# Patient Record
Sex: Female | Born: 1935 | Race: White | Hispanic: No
Health system: Southern US, Community
[De-identification: ages and names within clinical notes are randomized; demographics above are authoritative.]

## PROBLEM LIST (undated history)

## (undated) DIAGNOSIS — M1990 Unspecified osteoarthritis, unspecified site: Secondary | ICD-10-CM

## (undated) DIAGNOSIS — F419 Anxiety disorder, unspecified: Secondary | ICD-10-CM

## (undated) DIAGNOSIS — I10 Essential (primary) hypertension: Secondary | ICD-10-CM

## (undated) DIAGNOSIS — F0390 Unspecified dementia without behavioral disturbance: Secondary | ICD-10-CM

## (undated) DIAGNOSIS — K219 Gastro-esophageal reflux disease without esophagitis: Secondary | ICD-10-CM

## (undated) DIAGNOSIS — D649 Anemia, unspecified: Secondary | ICD-10-CM

## (undated) DIAGNOSIS — M545 Low back pain, unspecified: Secondary | ICD-10-CM

---
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
## 1998-12-24 ENCOUNTER — Ambulatory Visit (HOSPITAL_COMMUNITY): Admission: RE | Admit: 1998-12-24 | Discharge: 1998-12-24 | Payer: Self-pay | Admitting: *Deleted

## 2000-10-20 ENCOUNTER — Encounter: Admission: RE | Admit: 2000-10-20 | Discharge: 2000-12-11 | Payer: Self-pay

## 2000-11-13 ENCOUNTER — Ambulatory Visit (HOSPITAL_COMMUNITY): Admission: RE | Admit: 2000-11-13 | Discharge: 2000-11-14 | Payer: Self-pay | Admitting: Ophthalmology

## 2000-11-13 ENCOUNTER — Encounter: Payer: Self-pay | Admitting: Ophthalmology

## 2001-01-23 ENCOUNTER — Other Ambulatory Visit: Admission: RE | Admit: 2001-01-23 | Discharge: 2001-01-23 | Payer: Self-pay | Admitting: *Deleted

## 2002-04-10 ENCOUNTER — Encounter (HOSPITAL_COMMUNITY): Payer: Self-pay | Admitting: Oncology

## 2002-04-10 ENCOUNTER — Ambulatory Visit (HOSPITAL_COMMUNITY): Admission: RE | Admit: 2002-04-10 | Discharge: 2002-04-10 | Payer: Self-pay | Admitting: Oncology

## 2002-08-01 ENCOUNTER — Encounter: Payer: Self-pay | Admitting: Surgery

## 2002-08-01 ENCOUNTER — Ambulatory Visit (HOSPITAL_COMMUNITY): Admission: RE | Admit: 2002-08-01 | Discharge: 2002-08-01 | Payer: Self-pay | Admitting: Surgery

## 2002-10-15 ENCOUNTER — Encounter: Payer: Self-pay | Admitting: Surgery

## 2002-10-17 ENCOUNTER — Ambulatory Visit (HOSPITAL_COMMUNITY): Admission: RE | Admit: 2002-10-17 | Discharge: 2002-10-18 | Payer: Self-pay | Admitting: Surgery

## 2002-10-17 ENCOUNTER — Encounter (INDEPENDENT_AMBULATORY_CARE_PROVIDER_SITE_OTHER): Payer: Self-pay | Admitting: *Deleted

## 2004-05-25 ENCOUNTER — Ambulatory Visit (HOSPITAL_COMMUNITY): Admission: RE | Admit: 2004-05-25 | Discharge: 2004-05-25 | Payer: Self-pay | Admitting: Oncology

## 2004-05-25 ENCOUNTER — Ambulatory Visit: Payer: Self-pay | Admitting: Oncology

## 2004-11-22 ENCOUNTER — Ambulatory Visit: Payer: Self-pay | Admitting: Oncology

## 2005-05-23 ENCOUNTER — Ambulatory Visit: Payer: Self-pay | Admitting: Oncology

## 2005-11-17 ENCOUNTER — Ambulatory Visit: Payer: Self-pay | Admitting: Oncology

## 2005-11-22 LAB — COMPREHENSIVE METABOLIC PANEL
Albumin: 4.3 g/dL (ref 3.5–5.2)
BUN: 26 mg/dL — ABNORMAL HIGH (ref 6–23)
CO2: 24 mEq/L (ref 19–32)
Calcium: 9.4 mg/dL (ref 8.4–10.5)
Chloride: 107 mEq/L (ref 96–112)
Creatinine, Ser: 1.03 mg/dL (ref 0.40–1.20)

## 2005-11-22 LAB — CBC WITH DIFFERENTIAL/PLATELET
Basophils Absolute: 0 10*3/uL (ref 0.0–0.1)
Eosinophils Absolute: 0.1 10*3/uL (ref 0.0–0.5)
HCT: 36.4 % (ref 34.8–46.6)
HGB: 12.5 g/dL (ref 11.6–15.9)
MONO#: 0.6 10*3/uL (ref 0.1–0.9)
NEUT#: 3.1 10*3/uL (ref 1.5–6.5)
NEUT%: 44.4 % (ref 39.6–76.8)
WBC: 6.9 10*3/uL (ref 3.9–10.0)
lymph#: 3.1 10*3/uL (ref 0.9–3.3)

## 2005-11-22 LAB — LACTATE DEHYDROGENASE: LDH: 163 U/L (ref 94–250)

## 2005-11-22 LAB — IGG, IGA, IGM
IgA: 92 mg/dL (ref 68–378)
IgM, Serum: 428 mg/dL — ABNORMAL HIGH (ref 60–263)

## 2006-05-18 ENCOUNTER — Ambulatory Visit: Payer: Self-pay | Admitting: Oncology

## 2007-04-04 ENCOUNTER — Ambulatory Visit: Payer: Self-pay | Admitting: Oncology

## 2007-04-10 LAB — CBC WITH DIFFERENTIAL/PLATELET
BASO%: 0.5 % (ref 0.0–2.0)
EOS%: 0.5 % (ref 0.0–7.0)
HCT: 39.9 % (ref 34.8–46.6)
MCH: 30.3 pg (ref 26.0–34.0)
MCHC: 34.3 g/dL (ref 32.0–36.0)
MCV: 88.6 fL (ref 81.0–101.0)
MONO%: 4.4 % (ref 0.0–13.0)
NEUT%: 60.3 % (ref 39.6–76.8)
RDW: 13.2 % (ref 11.3–14.5)
lymph#: 3.7 10*3/uL — ABNORMAL HIGH (ref 0.9–3.3)

## 2007-04-10 LAB — COMPREHENSIVE METABOLIC PANEL
ALT: 41 U/L — ABNORMAL HIGH (ref 0–35)
AST: 28 U/L (ref 0–37)
Alkaline Phosphatase: 102 U/L (ref 39–117)
BUN: 24 mg/dL — ABNORMAL HIGH (ref 6–23)
Calcium: 9.6 mg/dL (ref 8.4–10.5)
Chloride: 104 mEq/L (ref 96–112)
Creatinine, Ser: 0.79 mg/dL (ref 0.40–1.20)
Total Bilirubin: 0.5 mg/dL (ref 0.3–1.2)

## 2007-04-10 LAB — LACTATE DEHYDROGENASE: LDH: 186 U/L (ref 94–250)

## 2007-04-10 LAB — IGG, IGA, IGM: IgM, Serum: 358 mg/dL — ABNORMAL HIGH (ref 60–263)

## 2007-10-03 ENCOUNTER — Ambulatory Visit: Payer: Self-pay | Admitting: Oncology

## 2008-10-22 ENCOUNTER — Encounter: Admission: RE | Admit: 2008-10-22 | Discharge: 2008-10-22 | Payer: Self-pay | Admitting: Internal Medicine

## 2010-08-20 NOTE — Op Note (Signed)
NAME:  Kimberly Dominguez, Kimberly Dominguez               ACCOUNT NO.:  [id number]   MEDICAL RECORD NO.:  [id number]                   PATIENT TYPE:  OIB   LOCATION:  2550                                 FACILITY:  [facility]   PHYSICIAN:  [name], M.D.                DATE OF BIRTH:  1922-06-15   DATE OF PROCEDURE:  10/17/2002  DATE OF DISCHARGE:                                 OPERATIVE REPORT   PREOPERATIVE DIAGNOSIS:  Primary hyperparathyroidism.   POSTOPERATIVE DIAGNOSIS:  Primary hyperparathyroidism.   OPERATION PERFORMED:  Minimally invasive parathyroidectomy (left inferior  gland).   SURGEON:  [name], M.D.   ASSISTANT:  Abigail Miyamoto, M.D.   ANESTHESIA:  General.   ESTIMATED BLOOD LOSS:  Minimal.   PREPARATION:  Betadine.   COMPLICATIONS:  None.   INDICATIONS FOR PROCEDURE:  The patient is a 77 year old white female  referred by [name], M.D. with hypercalcemia.  The patient had  longstanding hypercalcemia with serum calcium levels ranging from 11.4 to  12.0.  Intact PTH level was elevated at 106.  Sestamibi scan was obtained  and showed an area of increased uptake in the left inferior position. The  patient was prepared and brought to the operating room at this time.   DESCRIPTION OF PROCEDURE:  The procedure was done in operating room # 16 at  the [facility]. [facility].  The patient was brought to the  operating room and placed in supine position on the operating room table.  Following administration of general anesthesia, the patient was positioned  and then prepped and draped in the usual strict aseptic fashion.  After  ascertaining that an adequate level of anesthesia had been obtained, the  Neoprobe was used to scan the neck.  An area of increased activity was noted  at the left inferior position.  Using a #10 blade, the incision was made in  the anterior neck just above head of the left clavicle.  Dissection was  carried down through  subcutaneous tissues.  The platysma was divided.  Hemostasis was obtained with the electrocautery.  Subplatysmal flaps were  elevated cephalad and caudad.  The strap muscles were identified and incised  in the midline.  Strap muscles were reflected laterally.  Inferior pole of  the left thyroid lobe was exposed.  Using the Neoprobe for guidance, an area  of increased activity was noted below the left thyroid lobe.  The  parathyroid gland was identified.  It was carefully dissected out.  Vascular  tributaries were divided between small and medium Ligaclips.  The gland was  dissected away from surrounding adipose tissue and excised completely.  It  was sectioned on the table and appeared to be parathyroid tissue.  It was  submitted to pathology for review.  Dr. Tammi Sou confirmed on frozen  section that parathyroid tissue was present consistent with parathyroid  adenoma.  The neck was again explored.  Good hemostasis was noted.  Recurrent laryngeal nerve was identified and preserved.  Small piece of  Surgicel was placed in the bed of the dissection.  Strap muscles were  reapproximated in the midline with interrupted 3-0 Vicryl sutures.  Platysma  was closed with interrupted 3-0 Vicryl sutures.  Skin edges were  anesthetized with local Marcaine anesthetic.  Skin edges were reapproximated  with interrupted 4-0 Vicryl subcuticular sutures.  Wound was washed and  dried and Steri-Strips are applied.  Sterile gauze dressings were applied.  The patient was awakened from anesthesia and brought to the recovery room in  stable condition.  The patient tolerated the procedure well.                                                [name], M.D.    TMG/MEDQ  D:  10/17/2002  T:  10/17/2002  Job:  [id number]   cc:   [name], M.D.  [address]  [place], [place] [address]  Fax: [phone]

## 2013-01-29 ENCOUNTER — Other Ambulatory Visit: Payer: Self-pay | Admitting: Nurse Practitioner

## 2013-01-29 ENCOUNTER — Ambulatory Visit
Admission: RE | Admit: 2013-01-29 | Discharge: 2013-01-29 | Disposition: A | Discharge status: Home / Self Care | Payer: Medicare Other | Source: Ambulatory Visit | Attending: Nurse Practitioner | Admitting: Nurse Practitioner

## 2013-01-29 DIAGNOSIS — M25551 Pain in right hip: Secondary | ICD-10-CM

## 2014-05-23 DIAGNOSIS — L989 Disorder of the skin and subcutaneous tissue, unspecified: Secondary | ICD-10-CM | POA: Diagnosis not present

## 2014-05-23 DIAGNOSIS — I129 Hypertensive chronic kidney disease with stage 1 through stage 4 chronic kidney disease, or unspecified chronic kidney disease: Secondary | ICD-10-CM | POA: Diagnosis not present

## 2014-05-23 DIAGNOSIS — N183 Chronic kidney disease, stage 3 (moderate): Secondary | ICD-10-CM | POA: Diagnosis not present

## 2014-05-26 ENCOUNTER — Other Ambulatory Visit: Payer: Self-pay | Admitting: Dermatology

## 2014-05-26 DIAGNOSIS — L918 Other hypertrophic disorders of the skin: Secondary | ICD-10-CM | POA: Diagnosis not present

## 2014-06-01 DIAGNOSIS — M19021 Primary osteoarthritis, right elbow: Secondary | ICD-10-CM | POA: Diagnosis not present

## 2014-07-14 DIAGNOSIS — H35033 Hypertensive retinopathy, bilateral: Secondary | ICD-10-CM | POA: Diagnosis not present

## 2014-07-14 DIAGNOSIS — H524 Presbyopia: Secondary | ICD-10-CM | POA: Diagnosis not present

## 2014-07-14 DIAGNOSIS — H40013 Open angle with borderline findings, low risk, bilateral: Secondary | ICD-10-CM | POA: Diagnosis not present

## 2014-07-14 DIAGNOSIS — Z961 Presence of intraocular lens: Secondary | ICD-10-CM | POA: Diagnosis not present

## 2014-09-03 ENCOUNTER — Emergency Department (HOSPITAL_COMMUNITY)
Admission: EM | Admit: 2014-09-03 | Discharge: 2014-09-03 | Disposition: A | Discharge status: Home / Self Care | Payer: Medicare Other | Attending: Emergency Medicine | Admitting: Emergency Medicine

## 2014-09-03 ENCOUNTER — Encounter (HOSPITAL_COMMUNITY): Payer: Self-pay | Admitting: Emergency Medicine

## 2014-09-03 ENCOUNTER — Emergency Department (HOSPITAL_COMMUNITY): Payer: Medicare Other

## 2014-09-03 DIAGNOSIS — Y9289 Other specified places as the place of occurrence of the external cause: Secondary | ICD-10-CM | POA: Insufficient documentation

## 2014-09-03 DIAGNOSIS — S065XAA Traumatic subdural hemorrhage with loss of consciousness status unknown, initial encounter: Secondary | ICD-10-CM

## 2014-09-03 DIAGNOSIS — Y9301 Activity, walking, marching and hiking: Secondary | ICD-10-CM | POA: Diagnosis not present

## 2014-09-03 DIAGNOSIS — R51 Headache: Secondary | ICD-10-CM | POA: Diagnosis not present

## 2014-09-03 DIAGNOSIS — S0990XA Unspecified injury of head, initial encounter: Secondary | ICD-10-CM | POA: Diagnosis not present

## 2014-09-03 DIAGNOSIS — K219 Gastro-esophageal reflux disease without esophagitis: Secondary | ICD-10-CM | POA: Diagnosis not present

## 2014-09-03 DIAGNOSIS — S0083XA Contusion of other part of head, initial encounter: Secondary | ICD-10-CM | POA: Diagnosis not present

## 2014-09-03 DIAGNOSIS — S299XXA Unspecified injury of thorax, initial encounter: Secondary | ICD-10-CM | POA: Diagnosis not present

## 2014-09-03 DIAGNOSIS — Y998 Other external cause status: Secondary | ICD-10-CM | POA: Insufficient documentation

## 2014-09-03 DIAGNOSIS — F0390 Unspecified dementia without behavioral disturbance: Secondary | ICD-10-CM | POA: Insufficient documentation

## 2014-09-03 DIAGNOSIS — W108XXA Fall (on) (from) other stairs and steps, initial encounter: Secondary | ICD-10-CM | POA: Insufficient documentation

## 2014-09-03 DIAGNOSIS — W19XXXA Unspecified fall, initial encounter: Secondary | ICD-10-CM

## 2014-09-03 DIAGNOSIS — S065X4A Traumatic subdural hemorrhage with loss of consciousness of 6 hours to 24 hours, initial encounter: Secondary | ICD-10-CM | POA: Insufficient documentation

## 2014-09-03 DIAGNOSIS — S3690XA Unspecified injury of unspecified intra-abdominal organ, initial encounter: Secondary | ICD-10-CM | POA: Diagnosis not present

## 2014-09-03 DIAGNOSIS — S065X9A Traumatic subdural hemorrhage with loss of consciousness of unspecified duration, initial encounter: Secondary | ICD-10-CM

## 2014-09-03 DIAGNOSIS — S065X0A Traumatic subdural hemorrhage without loss of consciousness, initial encounter: Secondary | ICD-10-CM | POA: Diagnosis not present

## 2014-09-03 HISTORY — DX: Unspecified dementia, unspecified severity, without behavioral disturbance, psychotic disturbance, mood disturbance, and anxiety: F03.90

## 2014-09-03 HISTORY — DX: Gastro-esophageal reflux disease without esophagitis: K21.9

## 2014-09-03 NOTE — ED Notes (Addendum)
Witnessed fall: Pt missed a step while walking down stairs and fell to floor, struck head on stair. Pt has baseline short-term memory loss. Large hematoma on left forehead. No LOC, no vomiting. 128/72, hr 84, rr16, cbg145. Alert. Here from "[employer]

## 2014-09-03 NOTE — ED Provider Notes (Signed)
CSN: [id number]     Arrival date & time 09/03/14  1853 History   First MD Initiated Contact with Patient 09/03/14 2003     Chief Complaint  Patient presents with  . Fall     (Consider location/radiation/quality/duration/timing/severity/associated sxs/prior Treatment) HPI The patient misstepped and fell down 2 stairs today prior to arrival. She struck her forehead on a carpeted floor and developed a large hematoma. There is no reported loss of consciousness. No vomiting. The patient is alert but has a baseline dementia with significant short-term memory loss. The patient does endorse pain in the area of her swelling. She is also indicated to her daughter some pain on the left side of her chest. She has not been observed to have any difficulty breathing. Past Medical History  Diagnosis Date  . Dementia   . GERD (gastroesophageal reflux disease)    History reviewed. No pertinent past surgical history. History reviewed. No pertinent family history. History  Substance Use Topics  . Smoking status: Unknown If Ever Smoked  . Smokeless tobacco: Not on file  . Alcohol Use: No   OB History    No data available     Review of Systems  Review systems Limited by dementia. No report of recent illness or symptoms.  Allergies  Review of patient's allergies indicates no known allergies.  Home Medications   Prior to Admission medications   Medication Sig Start Date End Date Taking? Authorizing Provider  acetaminophen (TYLENOL) 500 MG tablet Take 500 mg by mouth every 6 (six) hours as needed for mild pain or moderate pain.   Yes Historical Provider, MD  alum & mag hydroxide-simeth (MAALOX PLUS) 400-400-40 MG/5ML suspension Take 30 mLs by mouth 2 (two) times daily as needed for indigestion.   Yes Historical Provider, MD  donepezil (ARICEPT) 10 MG tablet Take 10 mg by mouth at bedtime.   Yes Historical Provider, MD  ferrous sulfate 325 (65 FE) MG tablet Take 325 mg by mouth daily with breakfast.    Yes Historical Provider, MD  memantine (NAMENDA XR) 28 MG CP24 24 hr capsule Take 28 mg by mouth daily.   Yes Historical Provider, MD  pantoprazole (PROTONIX) 40 MG tablet Take 40 mg by mouth daily.   Yes Historical Provider, MD  polyethylene glycol (MIRALAX / GLYCOLAX) packet Take 17 g by mouth daily.   Yes Historical Provider, MD  ramipril (ALTACE) 2.5 MG capsule Take 2.5 mg by mouth daily.   Yes Historical Provider, MD  sertraline (ZOLOFT) 50 MG tablet Take 50 mg by mouth daily.   Yes Historical Provider, MD   BP 145/74 mmHg  Pulse 63  Temp(Src) 97.7 F (36.5 C) (Oral)  Resp 16  SpO2 96% Physical Exam  Constitutional: She appears well-developed and well-nourished. No distress.  HENT:  5 cm hematoma to the left forehead. No laceration.  Eyes: EOM are normal. Pupils are equal, round, and reactive to light.  Neck: Neck supple.  No C-spine tenderness.  Cardiovascular: Normal rate, regular rhythm, normal heart sounds and intact distal pulses.   Pulmonary/Chest: Effort normal and breath sounds normal. She exhibits no tenderness.  Abdominal: Soft. Bowel sounds are normal. She exhibits no distension. There is no tenderness.  Musculoskeletal: Normal range of motion. She exhibits no edema or tenderness.  Patient demonstrates all range of motion of both upper extremities by waving them back and forth and also both lower extremities by a bicycling motion.  Neurological: She is alert. She has normal strength. Coordination normal. GCS  eye subscore is 4. GCS verbal subscore is 5. GCS motor subscore is 6.  Patient has evident short-term memory loss and dementia. She is alert and cooperative.  Skin: Skin is warm, dry and intact.  Psychiatric: She has a normal mood and affect.    ED Course  Procedures (including critical care time) Labs Review Labs Reviewed - No data to display  Imaging Review Dg Chest 2 View  09/03/2014   CLINICAL DATA:  Fall while walking down stairs.  Initial encounter.   EXAM: CHEST - 2 VIEW  COMPARISON:  None  FINDINGS: The heart size and mediastinal contours are within normal limits. Bibasilar scarring and atelectasis present. There is no evidence of pulmonary edema, consolidation, pneumothorax, nodule or pleural fluid. The visualized skeletal structures are unremarkable. There is likely a hiatal hernia.  IMPRESSION: No active disease.   Electronically Signed   By: Aletta Edouard M.D.   On: 09/03/2014 22:00   Dg Ribs Unilateral Left  09/03/2014   CLINICAL DATA:  Fall with left rib pain. Missed a step while walking downstairs, now with left anterior rib pain.  EXAM: LEFT RIBS - 2 VIEW  COMPARISON:  None.  FINDINGS: The cortical margins of the left ribs are intact. No fracture or destructive rib lesion. There is minimal linear atelectasis at the left lung base, no pneumothorax, pleural effusion or consolidation.  IMPRESSION: Intact left ribs without fracture.   Electronically Signed   By: Jeb Levering M.D.   On: 09/03/2014 22:00   Ct Head Wo Contrast  09/04/2014   CLINICAL DATA:  81 year old female with history of subdural hematoma and scalp hematoma in the left frontal region after a fall on 09/03/2014. Followup examination.  EXAM: CT HEAD WITHOUT CONTRAST  TECHNIQUE: Contiguous axial images were obtained from the base of the skull through the vertex without intravenous contrast.  COMPARISON:  Head CT 09/03/2014.  FINDINGS: Again noted is a small left frontal scalp hematoma which appears slightly decreased in size compared to the prior examination. Underlying this in the left frontal region there is no residual high attenuation material to suggest residual subdural hematoma. The previously noted subdural hematoma has presumably shifted or resolved. No intraparenchymal hemorrhage or intraventricular hemorrhage. No hydrocephalus. Patchy areas of mild decreased attenuation throughout the deep and periventricular white matter of the cerebral hemispheres is compatible with very  mild chronic microvascular ischemic disease, similar to the prior examination. Mild cerebral atrophy. No intra-axial mass. No acute displaced skull fractures. Visualized paranasal sinuses and mastoids are well pneumatized.  IMPRESSION: 1. Resolution of previously suspected left frontal subdural hematoma. Slight decreased size of left frontal scalp hematoma. 2. Mild cerebral atrophy and mild chronic microvascular ischemic changes in the cerebral white matter.   Electronically Signed   By: Vinnie Langton M.D.   On: 09/04/2014 18:22   Ct Head Wo Contrast  09/03/2014   CLINICAL DATA:  81 year old female post witnessed fall. Missed a step wall walking downstairs, fall to floor striking head. Hematoma to left forehead. No loss of consciousness.  EXAM: CT HEAD WITHOUT CONTRAST  TECHNIQUE: Contiguous axial images were obtained from the base of the skull through the vertex without intravenous contrast.  COMPARISON:  None.  FINDINGS: Suspect small left frontal subdural hematoma measuring 4 mm in depth. This is immediately subjacent to a left frontal scalp hematoma. No calvarial fracture. There is no associated mass effect or midline shift. Age related volume loss and chronic small vessel ischemic change. No territorial infarct. No hydrocephalus,  basilar cisterns are patent. Paranasal sinuses and mastoid air cells are well aerated.  IMPRESSION: Suspect thin left frontal subdural hematoma measuring 4 mm. This is immediately subjacent to a left frontal scalp hematoma.  Critical Value/emergent results were called by telephone at the time of interpretation on 09/03/2014 at 9:50 pm to Dr. Charlesetta Shanks , who verbally acknowledged these results.   Electronically Signed   By: Jeb Levering M.D.   On: 09/03/2014 21:51     EKG Interpretation None     Consult: Patient's case was reviewed with Dr. Sherwood Gambler of neurosurgery. He advises that this is a very small or potentially artifact finding. This would not need neurosurgical  intervention. Options for management are home observation with return if changing symptoms or 24-hour observation in the hospital. MDM   Final diagnoses:  Subdural hematoma  Traumatic hematoma of forehead, initial encounter   The patient daughter feels comfortable taking her home and observing her for any signs of change. Instructions are provided. The patient is alert and in no distress. She has significant dementia but is neurologically intact for following commands and motor function.    Charlesetta Shanks, MD 09/05/14 [phone]

## 2014-09-03 NOTE — Discharge Instructions (Signed)
Subdural Hematoma  A subdural hematoma is a collection of blood between the brain and its tough outermost membrane covering (the dura).  Blood clots that form in this area push down on the brain and cause irritation. A subdural hematoma may cause parts of the brain to stop working and eventually cause death.   CAUSES  A subdural hematoma is caused by bleeding from a ruptured blood vessel (hemorrhage). The bleeding results from trauma to the head, such as from a fall or motor vehicle accident.  There are two types of subdural hemorrhages:  · Acute. This type develops shortly after a serious blow to the head and causes blood to collect very quickly. If not diagnosed and treated promptly, severe brain injury or death can occur.  · Chronic. This is when bleeding develops more slowly, over weeks or months.  RISK FACTORS  People at risk for subdural hematoma include older persons, infants, and alcoholics.  SYMPTOMS  An acute subdural hemorrhage develops over minutes to hours. Symptoms can include:  · Temporary loss of consciousness.  · Weakness of arms or legs on one side of the body.  · Changes in vision or speech.  · A severe headache.  · Seizures.  · Nausea and vomiting.  · Increased sleepiness.  A chronic subdural hemorrhage develops over weeks to months. Symptoms may develop slowly and produce less noticeable problems or changes. Symptoms include:  · A mild headache.  · A change in personality.  · Loss of balance or difficulty walking.  · Weakness, numbness, or tingling in the arms or legs.  · Nausea or vomiting.  · Memory loss.  · Double vision.  · Increased sleepiness.  DIAGNOSIS  Your health care provider will perform a thorough physical and neurological exam. A CT scan or MRI may also be done. If there is blood on the scan, its color will help your health care provider determine how long the hemorrhage has been there.  TREATMENT  If the cause is an acute subdural hemorrhage, immediate treatment is needed. In many  cases an emergency surgery is performed to drain accumulated blood or to remove the blood clot. Sometimes steroid or diuretic medicines or controlled breathing through a ventilator is needed to decrease pressure in the brain. This is especially true if there is any swelling of the brain.  If the cause is a chronic subdural hemorrhage, treatment depends on a variety of factors. Sometimes no treatment is needed. If the subdural hematoma is small and causes minimal or no symptoms, you may be treated with bed rest, medicines, and observation. If the hemorrhage is large or if you have neurological symptoms, an emergency surgery is usually needed to remove the blood clot.  People who develop a subdural hemorrhage are at risk of developing seizures, even after the subdural hematoma has been treated. You may be prescribed an anti-seizure (anticonvulsant) medicine for a year or longer.  HOME CARE INSTRUCTIONS  · Only take medicines as directed by your health care provider.  · Rest if directed by your health care provider.  · Keep all follow-up appointments with your health care provider.  · If you play a contact sport such as football, hockey or soccer and you experienced a significant head injury, allow enough time for healing (up to 15 days) before you start playing again. A repeated injury that occurs during this fragile repair period is likely to result in hemorrhage. This is called the second impact syndrome.  SEEK IMMEDIATE MEDICAL CARE IF:  ·   You fall or experience minor trauma to your head and you are taking blood thinners. If you are on any blood thinners even a very small injury can cause a subdural hematoma. You should not hesitate to seek medical attention regardless of how minor you think your symptoms are.  · You experience a head injury and have:  ¨ Drowsiness or a decrease in alertness.  ¨ Confusion or forgetfulness.  ¨ Slurred speech.  ¨ Irrational or aggressive behavior.  ¨ Numbness or paralysis in any part  of the body.  ¨ A feeling of being sick to your stomach (nauseous) or you throw up (vomit).  ¨ Difficulty walking or poor coordination.  ¨ Double vision.  ¨ Seizures.  ¨ A bleeding disorder.  ¨ A history of heavy alcohol use.  ¨ Clear fluid draining from your nose or ears.  ¨ Personality changes.  ¨ Difficulty thinking.  ¨ Worsening symptoms.  MAKE SURE YOU:  · Understand these instructions.  · Will watch your condition.  · Will get help right away if you are not doing well or get worse.  FOR MORE INFORMATION  National Institute of Neurological Disorders and Stroke: www.ninds.nih.gov  American Association of Neurological Surgeons: www.neurosurgerytoday.org  American Academy of Neurology (AAN): www.aan.com  Brain Injury Association of America: www.biausa.org  Document Released: 02/06/2004 Document Revised: 01/09/2013 Document Reviewed: 09/21/2012  ExitCare® Patient Information ©2015 [employer]. This information is not intended to replace advice given to you by your health care provider. Make sure you discuss any questions you have with your health care provider.

## 2014-09-03 NOTE — Progress Notes (Signed)
  Called by Dr. Charlesetta Shanks re: patient.  She reports patient fell earlier today, and has swollen bruise on forehead.  She also explained patient is at baseline neurologically (awake, alert, MAEW), with some STM loss c/w baseline dementia.  She noted the patient is not taking any anticoagulants or antiplt agents.  CT reviewed at Dr. Samson Frederic request, and shows minimal left frontal SDH, if present.   Studies/Results: Dg Chest 2 View  09/03/2014   CLINICAL DATA:  Fall while walking down stairs.  Initial encounter.  EXAM: CHEST - 2 VIEW  COMPARISON:  None  FINDINGS: The heart size and mediastinal contours are within normal limits. Bibasilar scarring and atelectasis present. There is no evidence of pulmonary edema, consolidation, pneumothorax, nodule or pleural fluid. The visualized skeletal structures are unremarkable. There is likely a hiatal hernia.  IMPRESSION: No active disease.   Electronically Signed   By: Aletta Edouard M.D.   On: 09/03/2014 22:00   Dg Ribs Unilateral Left  09/03/2014   CLINICAL DATA:  Fall with left rib pain. Missed a step while walking downstairs, now with left anterior rib pain.  EXAM: LEFT RIBS - 2 VIEW  COMPARISON:  None.  FINDINGS: The cortical margins of the left ribs are intact. No fracture or destructive rib lesion. There is minimal linear atelectasis at the left lung base, no pneumothorax, pleural effusion or consolidation.  IMPRESSION: Intact left ribs without fracture.   Electronically Signed   By: Jeb Levering M.D.   On: 09/03/2014 22:00   Ct Head Wo Contrast  09/03/2014   CLINICAL DATA:  81 year old female post witnessed fall. Missed a step wall walking downstairs, fall to floor striking head. Hematoma to left forehead. No loss of consciousness.  EXAM: CT HEAD WITHOUT CONTRAST  TECHNIQUE: Contiguous axial images were obtained from the base of the skull through the vertex without intravenous contrast.  COMPARISON:  None.  FINDINGS: Suspect small left frontal  subdural hematoma measuring 4 mm in depth. This is immediately subjacent to a left frontal scalp hematoma. No calvarial fracture. There is no associated mass effect or midline shift. Age related volume loss and chronic small vessel ischemic change. No territorial infarct. No hydrocephalus, basilar cisterns are patent. Paranasal sinuses and mastoid air cells are well aerated.  IMPRESSION: Suspect thin left frontal subdural hematoma measuring 4 mm. This is immediately subjacent to a left frontal scalp hematoma.  Critical Value/emergent results were called by telephone at the time of interpretation on 09/03/2014 at 9:50 pm to Dr. Charlesetta Shanks , who verbally acknowledged these results.   Electronically Signed   By: Jeb Levering M.D.   On: 09/03/2014 21:51    Assessment/Plan: Explained to Dr. Johnney Killian, that no neurosurgical intervention is indicated at this time, and I doubt that it will be be needed going forward.  The alternatives for management are admission to the hospitalist service, with a follow up CT of the brain without contrast in 24 hour, with D/C if stable on 6/3 AM, and follow up with primary MD, or D/C home, with head injury instructions for the family, if they are comfortable with monitoring her condition, and having her return to the ER for any difficulties or deterioration.   Hosie Spangle, MD 09/03/2014, 10:34 PM

## 2014-09-03 NOTE — ED Notes (Signed)
Pt and daughter upset MD has not been at bedside, pt unable to rate pain. Pt provide cryotherapy.

## 2014-09-03 NOTE — ED Notes (Signed)
Pt alert, no change in mental status. Daughter at bedside, plan explained to patient and family.

## 2014-09-03 NOTE — ED Notes (Signed)
Bed: SP32 Expected date:  Expected time:  Means of arrival:  Comments: EMS 81 yo female from assisted living-fall/head pain-no other complaints/no blood thinners

## 2014-09-04 ENCOUNTER — Ambulatory Visit
Admission: RE | Admit: 2014-09-04 | Discharge: 2014-09-04 | Disposition: A | Discharge status: Home / Self Care | Payer: Medicare Other | Source: Ambulatory Visit | Attending: Geriatric Medicine | Admitting: Geriatric Medicine

## 2014-09-04 ENCOUNTER — Other Ambulatory Visit: Payer: Self-pay | Admitting: Geriatric Medicine

## 2014-09-04 DIAGNOSIS — S065X9A Traumatic subdural hemorrhage with loss of consciousness of unspecified duration, initial encounter: Secondary | ICD-10-CM

## 2014-09-04 DIAGNOSIS — S065XAA Traumatic subdural hemorrhage with loss of consciousness status unknown, initial encounter: Secondary | ICD-10-CM

## 2014-09-04 DIAGNOSIS — S0003XA Contusion of scalp, initial encounter: Secondary | ICD-10-CM | POA: Diagnosis not present

## 2014-10-07 DIAGNOSIS — Z79899 Other long term (current) drug therapy: Secondary | ICD-10-CM | POA: Diagnosis not present

## 2014-10-07 DIAGNOSIS — N183 Chronic kidney disease, stage 3 (moderate): Secondary | ICD-10-CM | POA: Diagnosis not present

## 2014-10-07 DIAGNOSIS — E039 Hypothyroidism, unspecified: Secondary | ICD-10-CM | POA: Diagnosis not present

## 2014-10-07 DIAGNOSIS — G301 Alzheimer's disease with late onset: Secondary | ICD-10-CM | POA: Diagnosis not present

## 2014-10-07 DIAGNOSIS — D472 Monoclonal gammopathy: Secondary | ICD-10-CM | POA: Diagnosis not present

## 2014-10-07 DIAGNOSIS — I129 Hypertensive chronic kidney disease with stage 1 through stage 4 chronic kidney disease, or unspecified chronic kidney disease: Secondary | ICD-10-CM | POA: Diagnosis not present

## 2015-05-11 ENCOUNTER — Emergency Department (HOSPITAL_COMMUNITY)
Admission: EM | Admit: 2015-05-11 | Discharge: 2015-05-11 | Disposition: A | Discharge status: Home / Self Care | Payer: Medicare Other | Attending: Emergency Medicine | Admitting: Emergency Medicine

## 2015-05-11 ENCOUNTER — Encounter (HOSPITAL_COMMUNITY): Payer: Self-pay | Admitting: Emergency Medicine

## 2015-05-11 DIAGNOSIS — R748 Abnormal levels of other serum enzymes: Secondary | ICD-10-CM | POA: Diagnosis not present

## 2015-05-11 DIAGNOSIS — Z79899 Other long term (current) drug therapy: Secondary | ICD-10-CM | POA: Insufficient documentation

## 2015-05-11 DIAGNOSIS — R112 Nausea with vomiting, unspecified: Secondary | ICD-10-CM

## 2015-05-11 DIAGNOSIS — F0390 Unspecified dementia without behavioral disturbance: Secondary | ICD-10-CM | POA: Insufficient documentation

## 2015-05-11 DIAGNOSIS — K219 Gastro-esophageal reflux disease without esophagitis: Secondary | ICD-10-CM | POA: Diagnosis not present

## 2015-05-11 LAB — CBC
HCT: 42.2 % (ref 36.0–46.0)
HEMOGLOBIN: 14 g/dL (ref 12.0–15.0)
MCH: 30.2 pg (ref 26.0–34.0)
MCHC: 33.2 g/dL (ref 30.0–36.0)
MCV: 91.1 fL (ref 78.0–100.0)
PLATELETS: 218 10*3/uL (ref 150–400)
RBC: 4.63 MIL/uL (ref 3.87–5.11)
RDW: 13.5 % (ref 11.5–15.5)
WBC: 10.4 10*3/uL (ref 4.0–10.5)

## 2015-05-11 LAB — COMPREHENSIVE METABOLIC PANEL
ALK PHOS: 77 U/L (ref 38–126)
ALT: 16 U/L (ref 14–54)
AST: 26 U/L (ref 15–41)
Albumin: 4.5 g/dL (ref 3.5–5.0)
Anion gap: 13 (ref 5–15)
BUN: 34 mg/dL — ABNORMAL HIGH (ref 6–20)
CO2: 21 mmol/L — ABNORMAL LOW (ref 22–32)
Calcium: 9.5 mg/dL (ref 8.9–10.3)
Chloride: 108 mmol/L (ref 101–111)
Creatinine, Ser: 1.02 mg/dL — ABNORMAL HIGH (ref 0.44–1.00)
GFR, EST AFRICAN AMERICAN: 54 mL/min — AB (ref >60)
GFR, EST NON AFRICAN AMERICAN: 47 mL/min — AB (ref >60)
Glucose, Bld: 154 mg/dL — ABNORMAL HIGH (ref 65–99)
Potassium: 4.3 mmol/L (ref 3.5–5.1)
Sodium: 142 mmol/L (ref 135–145)
Total Bilirubin: 0.6 mg/dL (ref 0.3–1.2)
Total Protein: 7.8 g/dL (ref 6.5–8.1)

## 2015-05-11 LAB — URINALYSIS, ROUTINE W REFLEX MICROSCOPIC
BILIRUBIN URINE: NEGATIVE
Glucose, UA: NEGATIVE mg/dL
Ketones, ur: NEGATIVE mg/dL
Leukocytes, UA: NEGATIVE
Nitrite: NEGATIVE
Protein, ur: NEGATIVE mg/dL
Specific Gravity, Urine: 1.018 (ref 1.005–1.030)
pH: 5 (ref 5.0–8.0)

## 2015-05-11 LAB — DIFFERENTIAL
Basophils Absolute: 0 10*3/uL (ref 0.0–0.1)
Basophils Relative: 0 %
EOS ABS: 0 10*3/uL (ref 0.0–0.7)
EOS PCT: 0 %
Lymphocytes Relative: 7 %
Lymphs Abs: 0.7 10*3/uL (ref 0.7–4.0)
Monocytes Absolute: 0.6 10*3/uL (ref 0.1–1.0)
Monocytes Relative: 6 %
NEUTROS PCT: 87 %
Neutro Abs: 9.1 10*3/uL — ABNORMAL HIGH (ref 1.7–7.7)

## 2015-05-11 LAB — URINE MICROSCOPIC-ADD ON
Bacteria, UA: NONE SEEN
RBC / HPF: NONE SEEN RBC/hpf (ref 0–5)

## 2015-05-11 LAB — LIPASE, BLOOD: Lipase: 269 U/L — ABNORMAL HIGH (ref 11–51)

## 2015-05-11 MED ORDER — ONDANSETRON HCL 4 MG/2ML IJ SOLN: 4.0000 mg | Freq: Once | INTRAMUSCULAR | Status: DC

## 2015-05-11 MED ORDER — ONDANSETRON HCL 4 MG/2ML IJ SOLN
4.0000 mg | Freq: Once | INTRAMUSCULAR | Status: AC | PRN
  Administered 2015-05-11: 4 mg via INTRAVENOUS
  Filled 2015-05-11: qty 2

## 2015-05-11 MED ORDER — SODIUM CHLORIDE 0.9 % IV SOLN
1000.0000 mL | INTRAVENOUS | Status: DC
  Administered 2015-05-11: 1000 mL via INTRAVENOUS

## 2015-05-11 MED ORDER — ONDANSETRON HCL 4 MG PO TABS: 4.0000 mg | ORAL_TABLET | Freq: Four times a day (QID) | ORAL | Status: AC | PRN

## 2015-05-11 MED ORDER — SODIUM CHLORIDE 0.9 % IV SOLN
1000.0000 mL | Freq: Once | INTRAVENOUS | Status: AC
  Administered 2015-05-11: 1000 mL via INTRAVENOUS

## 2015-05-11 NOTE — ED Provider Notes (Signed)
CSN: [id number]     Arrival date & time 05/11/15  0119 History  By signing my name below, I, Irene Pap, attest that this documentation has been prepared under the direction and in the presence of Delora Fuel, MD. Electronically Signed: Irene Pap, ED Scribe. 05/11/2015. 2:25 AM.  Chief Complaint  Patient presents with  . Emesis   The history is provided by a relative. No language interpreter was used.  HPI Comments (Level 5 Caveat due to dementia): Kimberly Dominguez is a 82 y.o. Female with a hx of dementia brought in from [facility] who presents to the Emergency Department complaining of emesis onset 4 hours ago. Family member states that staff at her nursing home reported 3 episodes of emesis and that the pt began to turn blue around the lips and pale. Pt states that she does not know why she is here or where she is. She currently denies pain.  Past Medical History  Diagnosis Date  . Dementia   . GERD (gastroesophageal reflux disease)    History reviewed. No pertinent past surgical history. No family history on file. Social History  Substance Use Topics  . Smoking status: Unknown If Ever Smoked  . Smokeless tobacco: None  . Alcohol Use: No   OB History    No data available     Review of Systems  Unable to perform ROS: Dementia   Allergies  Review of patient's allergies indicates no known allergies.  Home Medications   Prior to Admission medications   Medication Sig Start Date End Date Taking? Authorizing Provider  acetaminophen (TYLENOL) 500 MG tablet Take 500 mg by mouth every 6 (six) hours as needed for mild pain or moderate pain.    Historical Provider, MD  alum & mag hydroxide-simeth (MAALOX PLUS) 400-400-40 MG/5ML suspension Take 30 mLs by mouth 2 (two) times daily as needed for indigestion.    Historical Provider, MD  donepezil (ARICEPT) 10 MG tablet Take 10 mg by mouth at bedtime.    Historical Provider, MD  ferrous sulfate 325 (65 FE) MG  tablet Take 325 mg by mouth daily with breakfast.    Historical Provider, MD  memantine (NAMENDA XR) 28 MG CP24 24 hr capsule Take 28 mg by mouth daily.    Historical Provider, MD  pantoprazole (PROTONIX) 40 MG tablet Take 40 mg by mouth daily.    Historical Provider, MD  polyethylene glycol (MIRALAX / GLYCOLAX) packet Take 17 g by mouth daily.    Historical Provider, MD  ramipril (ALTACE) 2.5 MG capsule Take 2.5 mg by mouth daily.    Historical Provider, MD  sertraline (ZOLOFT) 50 MG tablet Take 50 mg by mouth daily.    Historical Provider, MD   SpO2 97% Physical Exam  Constitutional: She appears well-developed and well-nourished.  HENT:  Head: Normocephalic and atraumatic.  Eyes: EOM are normal. Pupils are equal, round, and reactive to light.  Neck: Normal range of motion. Neck supple. No JVD present.  Cardiovascular: Normal rate, regular rhythm and normal heart sounds.  Exam reveals no gallop and no friction rub.   No murmur heard. Pulmonary/Chest: Effort normal and breath sounds normal. She has no wheezes. She has no rales. She exhibits no tenderness.  Abdominal: Soft. She exhibits no distension and no mass. Bowel sounds are decreased. There is no tenderness.  Musculoskeletal: Normal range of motion. She exhibits no edema.  Lymphadenopathy:    She has no cervical adenopathy.  Neurological: She is alert. No cranial nerve  deficit. She exhibits normal muscle tone. Coordination normal.  Oriented to person, but not place or time  Skin: Skin is warm and dry. No rash noted.  Psychiatric: She has a normal mood and affect.  Nursing note and vitals reviewed.   ED Course  Procedures (including critical care time) DIAGNOSTIC STUDIES: Oxygen Saturation is 97% on RA, normal by my interpretation.    COORDINATION OF CARE: 1:48 AM-Discussed treatment plan which includes labs with family at bedside and family agreed to plan.    Labs Review Results for orders placed or performed during the  hospital encounter of 05/11/15  Lipase, blood  Result Value Ref Range   Lipase 269 (H) 11 - 51 U/L  Comprehensive metabolic panel  Result Value Ref Range   Sodium 142 135 - 145 mmol/L   Potassium 4.3 3.5 - 5.1 mmol/L   Chloride 108 101 - 111 mmol/L   CO2 21 (L) 22 - 32 mmol/L   Glucose, Bld 154 (H) 65 - 99 mg/dL   BUN 34 (H) 6 - 20 mg/dL   Creatinine, Ser 1.02 (H) 0.44 - 1.00 mg/dL   Calcium 9.5 8.9 - 10.3 mg/dL   Total Protein 7.8 6.5 - 8.1 g/dL   Albumin 4.5 3.5 - 5.0 g/dL   AST 26 15 - 41 U/L   ALT 16 14 - 54 U/L   Alkaline Phosphatase 77 38 - 126 U/L   Total Bilirubin 0.6 0.3 - 1.2 mg/dL   GFR calc non Af Amer 47 (L) >60 mL/min   GFR calc Af Amer 54 (L) >60 mL/min   Anion gap 13 5 - 15  CBC  Result Value Ref Range   WBC 10.4 4.0 - 10.5 K/uL   RBC 4.63 3.87 - 5.11 MIL/uL   Hemoglobin 14.0 12.0 - 15.0 g/dL   HCT 42.2 36.0 - 46.0 %   MCV 91.1 78.0 - 100.0 fL   MCH 30.2 26.0 - 34.0 pg   MCHC 33.2 30.0 - 36.0 g/dL   RDW 13.5 11.5 - 15.5 %   Platelets 218 150 - 400 K/uL  Urinalysis, Routine w reflex microscopic (not at [facility])  Result Value Ref Range   Color, Urine YELLOW YELLOW   APPearance CLOUDY (A) CLEAR   Specific Gravity, Urine 1.018 1.005 - 1.030   pH 5.0 5.0 - 8.0   Glucose, UA NEGATIVE NEGATIVE mg/dL   Hgb urine dipstick TRACE (A) NEGATIVE   Bilirubin Urine NEGATIVE NEGATIVE   Ketones, ur NEGATIVE NEGATIVE mg/dL   Protein, ur NEGATIVE NEGATIVE mg/dL   Nitrite NEGATIVE NEGATIVE   Leukocytes, UA NEGATIVE NEGATIVE  Differential  Result Value Ref Range   Neutrophils Relative % 87 %   Neutro Abs 9.1 (H) 1.7 - 7.7 K/uL   Lymphocytes Relative 7 %   Lymphs Abs 0.7 0.7 - 4.0 K/uL   Monocytes Relative 6 %   Monocytes Absolute 0.6 0.1 - 1.0 K/uL   Eosinophils Relative 0 %   Eosinophils Absolute 0.0 0.0 - 0.7 K/uL   Basophils Relative 0 %   Basophils Absolute 0.0 0.0 - 0.1 K/uL  Urine microscopic-add on  Result Value Ref Range   Squamous Epithelial / LPF 0-5 (A)  NONE SEEN   WBC, UA 0-5 0 - 5 WBC/hpf   RBC / HPF NONE SEEN 0 - 5 RBC/hpf   Bacteria, UA NONE SEEN NONE SEEN   I have personally reviewed and evaluated these images and lab results as part of my medical decision-making.  MDM   Final diagnoses:  Nausea and vomiting, vomiting of unspecified type  Elevated lipase    Nausea and vomiting with benign physical exam. Likely viral gastritis. Screening labs are obtained and are significant for elevated lipase. However, she has no abdominal pain and no tenderness so I doubt pancreatitis. Normal transaminases and alkaline phosphatase make gallstone pancreatitis unlikely. She was given IV fluids and IV ondansetron and has had no further vomiting in the ED. I discussed the findings with her daughter and explained rationale for not hospitalizing her with no clinical symptoms of Pancreatitis. She expresses understanding. She is discharged back to her nursing home with prescription for ondansetron for nausea but advised to return if she should develop abdominal pain.  I personally performed the services described in this documentation, which was scribed in my presence. The recorded information has been reviewed and is accurate.      Delora Fuel, MD [phone]

## 2015-05-11 NOTE — ED Notes (Signed)
Pt reminded of need for urine sample.  

## 2015-05-11 NOTE — ED Notes (Signed)
Ambulated pt to restroom. Pt urinated and had stool in the hat in the toilet. Will in and out cath pt.

## 2015-05-11 NOTE — Discharge Instructions (Signed)
Return if vomiting is not being controlled with ondansetron, or if he starts developing abdominal pain.  Nausea and Vomiting Nausea is a sick feeling that often comes before throwing up (vomiting). Vomiting is a reflex where stomach contents come out of your mouth. Vomiting can cause severe loss of body fluids (dehydration). Children and elderly adults can become dehydrated quickly, especially if they also have diarrhea. Nausea and vomiting are symptoms of a condition or disease. It is important to find the cause of your symptoms. CAUSES   Direct irritation of the stomach lining. This irritation can result from increased acid production (gastroesophageal reflux disease), infection, food poisoning, taking certain medicines (such as nonsteroidal anti-inflammatory drugs), alcohol use, or tobacco use.  Signals from the brain.These signals could be caused by a headache, heat exposure, an inner ear disturbance, increased pressure in the brain from injury, infection, a tumor, or a concussion, pain, emotional stimulus, or metabolic problems.  An obstruction in the gastrointestinal tract (bowel obstruction).  Illnesses such as diabetes, hepatitis, gallbladder problems, appendicitis, kidney problems, cancer, sepsis, atypical symptoms of a heart attack, or eating disorders.  Medical treatments such as chemotherapy and radiation.  Receiving medicine that makes you sleep (general anesthetic) during surgery. DIAGNOSIS Your caregiver may ask for tests to be done if the problems do not improve after a few days. Tests may also be done if symptoms are severe or if the reason for the nausea and vomiting is not clear. Tests may include:  Urine tests.  Blood tests.  Stool tests.  Cultures (to look for evidence of infection).  X-rays or other imaging studies. Test results can help your caregiver make decisions about treatment or the need for additional tests. TREATMENT You need to stay well hydrated. Drink  frequently but in small amounts.You may wish to drink water, sports drinks, clear broth, or eat frozen ice pops or gelatin dessert to help stay hydrated.When you eat, eating slowly may help prevent nausea.There are also some antinausea medicines that may help prevent nausea. HOME CARE INSTRUCTIONS   Take all medicine as directed by your caregiver.  If you do not have an appetite, do not force yourself to eat. However, you must continue to drink fluids.  If you have an appetite, eat a normal diet unless your caregiver tells you differently.  Eat a variety of complex carbohydrates (rice, wheat, potatoes, bread), lean meats, yogurt, fruits, and vegetables.  Avoid high-fat foods because they are more difficult to digest.  Drink enough water and fluids to keep your urine clear or pale yellow.  If you are dehydrated, ask your caregiver for specific rehydration instructions. Signs of dehydration may include:  Severe thirst.  Dry lips and mouth.  Dizziness.  Dark urine.  Decreasing urine frequency and amount.  Confusion.  Rapid breathing or pulse. SEEK IMMEDIATE MEDICAL CARE IF:   You have blood or brown flecks (like coffee grounds) in your vomit.  You have black or bloody stools.  You have a severe headache or stiff neck.  You are confused.  You have severe abdominal pain.  You have chest pain or trouble breathing.  You do not urinate at least once every 8 hours.  You develop cold or clammy skin.  You continue to vomit for longer than 24 to 48 hours.  You have a fever. MAKE SURE YOU:   Understand these instructions.  Will watch your condition.  Will get help right away if you are not doing well or get worse.   This  information is not intended to replace advice given to you by your health care provider. Make sure you discuss any questions you have with your health care provider.   Document Released: 03/21/2005 Document Revised: 06/13/2011 Document Reviewed:  08/18/2010 Elsevier Interactive Patient Education [employer].

## 2015-05-11 NOTE — ED Notes (Signed)
Pt from [place] (on [address]) and began experiencing episodes of emesis around 2200. Pt is alert to person, place, and situation, but not time. Pt states she had 3 episodes of emesis tonight. Pt states she has no nausea at this current time. Pt has a history of dementia. Pt has a family member at bedside

## 2019-06-05 ENCOUNTER — Other Ambulatory Visit: Payer: Self-pay

## 2019-06-05 ENCOUNTER — Emergency Department (HOSPITAL_COMMUNITY): Payer: Medicare Other

## 2019-06-05 ENCOUNTER — Encounter (HOSPITAL_COMMUNITY): Payer: Self-pay

## 2019-06-05 ENCOUNTER — Emergency Department (HOSPITAL_COMMUNITY)
Admission: EM | Admit: 2019-06-05 | Discharge: 2019-06-06 | Disposition: A | Discharge status: Home / Self Care | Payer: Medicare Other | Attending: Emergency Medicine | Admitting: Emergency Medicine

## 2019-06-05 DIAGNOSIS — Y939 Activity, unspecified: Secondary | ICD-10-CM | POA: Insufficient documentation

## 2019-06-05 DIAGNOSIS — Y999 Unspecified external cause status: Secondary | ICD-10-CM | POA: Diagnosis not present

## 2019-06-05 DIAGNOSIS — S12101A Unspecified nondisplaced fracture of second cervical vertebra, initial encounter for closed fracture: Secondary | ICD-10-CM | POA: Diagnosis not present

## 2019-06-05 DIAGNOSIS — F0390 Unspecified dementia without behavioral disturbance: Secondary | ICD-10-CM | POA: Diagnosis not present

## 2019-06-05 DIAGNOSIS — Z79899 Other long term (current) drug therapy: Secondary | ICD-10-CM | POA: Diagnosis not present

## 2019-06-05 DIAGNOSIS — Y92129 Unspecified place in nursing home as the place of occurrence of the external cause: Secondary | ICD-10-CM | POA: Insufficient documentation

## 2019-06-05 DIAGNOSIS — S12601A Unspecified nondisplaced fracture of seventh cervical vertebra, initial encounter for closed fracture: Secondary | ICD-10-CM | POA: Insufficient documentation

## 2019-06-05 DIAGNOSIS — W19XXXA Unspecified fall, initial encounter: Secondary | ICD-10-CM | POA: Diagnosis not present

## 2019-06-05 DIAGNOSIS — S199XXA Unspecified injury of neck, initial encounter: Secondary | ICD-10-CM | POA: Diagnosis present

## 2019-06-05 DIAGNOSIS — S12100A Unspecified displaced fracture of second cervical vertebra, initial encounter for closed fracture: Secondary | ICD-10-CM

## 2019-06-05 NOTE — ED Provider Notes (Signed)
TIME SEEN: 11:32 PM  CHIEF COMPLAINT: Fall  HPI: Patient is a 86 year old female who presents from [facility] home after a fall.  Had an x-ray today concerning for C1 and C2 cervical spine fractures and was sent here for further evaluation including a CT scan.  Patient unable to provide any history given her dementia.  Does not appear that she is on antiplatelets or anticoagulants.  ROS: Level 5 caveat due to dementia  PAST MEDICAL HISTORY/PAST SURGICAL HISTORY:  Past Medical History:  Diagnosis Date  . Dementia ([place])   . GERD (gastroesophageal reflux disease)     MEDICATIONS:  Prior to Admission medications   Medication Sig Start Date End Date Taking? Authorizing Provider  acetaminophen (TYLENOL) 500 MG tablet Take 500 mg by mouth every 6 (six) hours as needed for mild pain or moderate pain.    [provider]  donepezil (ARICEPT) 10 MG tablet Take 10 mg by mouth at bedtime.    [provider]  ferrous sulfate 325 (65 FE) MG tablet Take 325 mg by mouth daily with breakfast.    [provider]  memantine (NAMENDA XR) 28 MG CP24 24 hr capsule Take 28 mg by mouth daily.    [provider]  ondansetron (ZOFRAN) 4 MG tablet Take 1 tablet (4 mg total) by mouth every 6 (six) hours as needed for nausea or vomiting. 0000000   Delora Fuel, MD  pantoprazole (PROTONIX) 40 MG tablet Take 40 mg by mouth daily.    [provider]  polyethylene glycol (MIRALAX / GLYCOLAX) packet Take 17 g by mouth daily.    [provider]  ramipril (ALTACE) 2.5 MG capsule Take 2.5 mg by mouth daily.    [provider]  sertraline (ZOLOFT) 50 MG tablet Take 50 mg by mouth daily.    [provider]    ALLERGIES:  No Known Allergies  SOCIAL HISTORY:  Social History   Tobacco Use  . Smoking status: Unknown If Ever Smoked  Substance Use Topics  . Alcohol use: No    FAMILY HISTORY: History reviewed. No pertinent family  history.  EXAM: BP (!) 144/71 (BP Location: Left Arm)   Pulse 71   Temp (!) 97.1 F (36.2 C) (Axillary)   Resp 16   SpO2 99%  CONSTITUTIONAL: Alert but does not answer questions or follow commands.  Thin.  Elderly. HEAD: Normocephalic; atraumatic EYES: Conjunctivae clear, PERRL, EOMI ENT: normal nose; no rhinorrhea; moist mucous membranes; no obvious dental injury, no septal hematoma NECK: Supple, no meningismus, no LAD; no midline spinal tenderness, step-off or deformity; trachea midline, cervical collar in place CARD: RRR; S1 and S2 appreciated; no murmurs, no clicks, no rubs, no gallops RESP: Normal chest excursion without splinting or tachypnea; breath sounds clear and equal bilaterally; no wheezes, no rhonchi, no rales; no hypoxia or respiratory distress CHEST:  chest wall stable, no crepitus or ecchymosis or deformity, nontender to palpation; no flail chest ABD/GI: Normal bowel sounds; non-distended; soft, non-tender, no rebound, no guarding; no ecchymosis or other lesions noted PELVIS:  stable, nontender to palpation BACK:  The back appears normal and is non-tender to palpation, there is no CVA tenderness; no midline spinal tenderness, step-off or deformity EXT: Normal ROM in all joints; non-tender to palpation; no edema; normal capillary refill; no cyanosis, no bony tenderness or bony deformity of patient's extremities, no joint effusion, compartments are soft, extremities are warm and well-perfused, no ecchymosis SKIN: Normal color for age and race; warm NEURO:  Moves all extremities minimally.  Unable to test sensation, speech.  No obvious facial asymmetry. PSYCH: Demented.  Calm.  MEDICAL DECISION MAKING: Patient here after fall at [facility].  Unclear if this was witnessed or not.  I have attempted to call [facility] several times without any answer.  Message has been left.  Will obtain CT of the head and cervical spine.  No other obvious sign of trauma on exam.  She currently  is in a cervical collar.  ED PROGRESS: CT scan shows nondisplaced posterior arch fractures at C2 and anterior body fracture at C7.  No acute intracranial injury.  Will place in [place] collar and discuss with neurosurgery on-call.  Spoke with Cicero Duck, patient daughter, [phone].  Nursing home staff found her sitting in the floor.  Can walk with walker normally.  Severe dementia.  Will talk if she knows you per daughter.  She states that is normal for her not to talk and if she does talk she does not make sense.    3:04 AM  D/w Dr. Christella Noa on call with neurosurgery.  He agrees with aspirin collar and that patient would likely not be operative candidate.  States she can follow-up in his office in 1 month.  Will discharge back to nursing facility.  At this time, I do not feel there is any life-threatening condition present. I have reviewed, interpreted and discussed all results (EKG, imaging, lab, urine as appropriate) and exam findings with patient/family. I have reviewed nursing notes and appropriate previous records.  I feel the patient is safe to be discharged home without further emergent workup and can continue workup as an outpatient as needed. Discussed usual and customary return precautions. Patient/family verbalize understanding and are comfortable with this plan.  Outpatient follow-up has been provided as needed. All questions have been answered.   CRITICAL CARE Performed by: Pryor Curia   Total critical care time: 45 minutes  Critical care time was exclusive of separately billable procedures and treating other patients.  Critical care was necessary to treat or prevent imminent or life-threatening deterioration.  Critical care was time spent personally by me on the following activities: development of treatment plan with patient and/or surrogate as well as nursing, discussions with consultants, evaluation of patient's response to treatment, examination of patient, obtaining  history from patient or surrogate, ordering and performing treatments and interventions, ordering and review of laboratory studies, ordering and review of radiographic studies, pulse oximetry and re-evaluation of patient's condition.  EMREE SHARAF was evaluated in Emergency Department on 06/05/2019 for the symptoms described in the history of present illness. She was evaluated in the context of the global COVID-19 pandemic, which necessitated consideration that the patient might be at risk for infection with the SARS-CoV-2 virus that causes COVID-19. Institutional protocols and algorithms that pertain to the evaluation of patients at risk for COVID-19 are in a state of rapid change based on information released by regulatory bodies including the CDC and federal and state organizations. These policies and algorithms were followed during the patient's care in the ED.  Patient was seen wearing N95, face shield, gloves.    Jay Haskew, Delice Bison, DO 06/06/19 [phone]

## 2019-06-05 NOTE — ED Triage Notes (Signed)
Patient brought to ED due to a fall earlier today @ [facility], xray done of neck and found a possible fracture, sent her out for Ct for further evaluation due to ongoing complaints of pain

## 2019-06-06 NOTE — Discharge Instructions (Addendum)
Please keep your aspen cervical collar on at all times.  We recommend follow up with neurosurgery in 4 weeks.

## 2019-06-06 NOTE — ED Notes (Signed)
Patient transported to CT 

## 2019-06-06 NOTE — ED Notes (Signed)
Called Neurosurgery x 2 for EDP.

## 2019-06-06 NOTE — ED Notes (Signed)
Pt. Documented in error see above note in chart. 

## 2019-06-28 ENCOUNTER — Emergency Department (HOSPITAL_COMMUNITY)
Admission: EM | Admit: 2019-06-28 | Discharge: 2019-06-28 | Disposition: A | Discharge status: Home / Self Care | Attending: Emergency Medicine | Admitting: Emergency Medicine

## 2019-06-28 ENCOUNTER — Emergency Department (HOSPITAL_COMMUNITY)

## 2019-06-28 DIAGNOSIS — S0101XA Laceration without foreign body of scalp, initial encounter: Secondary | ICD-10-CM | POA: Diagnosis not present

## 2019-06-28 DIAGNOSIS — Y939 Activity, unspecified: Secondary | ICD-10-CM | POA: Insufficient documentation

## 2019-06-28 DIAGNOSIS — Y999 Unspecified external cause status: Secondary | ICD-10-CM | POA: Insufficient documentation

## 2019-06-28 DIAGNOSIS — Z23 Encounter for immunization: Secondary | ICD-10-CM | POA: Insufficient documentation

## 2019-06-28 DIAGNOSIS — W06XXXA Fall from bed, initial encounter: Secondary | ICD-10-CM | POA: Diagnosis not present

## 2019-06-28 DIAGNOSIS — Y92122 Bedroom in nursing home as the place of occurrence of the external cause: Secondary | ICD-10-CM | POA: Insufficient documentation

## 2019-06-28 DIAGNOSIS — F0390 Unspecified dementia without behavioral disturbance: Secondary | ICD-10-CM | POA: Insufficient documentation

## 2019-06-28 DIAGNOSIS — W19XXXA Unspecified fall, initial encounter: Secondary | ICD-10-CM

## 2019-06-28 MED ORDER — TETANUS-DIPHTH-ACELL PERTUSSIS 5-2.5-18.5 LF-MCG/0.5 IM SUSP
0.5000 mL | Freq: Once | INTRAMUSCULAR | Status: AC
  Administered 2019-06-28: 21:00:00 0.5 mL via INTRAMUSCULAR
  Filled 2019-06-28: qty 0.5

## 2019-06-28 NOTE — ED Provider Notes (Signed)
[place] EMERGENCY DEPARTMENT Provider Note   CSN: [id number] Arrival date & time: 06/28/19  2048     History Chief Complaint  Patient presents with  . Fall    Kimberly Dominguez is a 86 y.o. female.  86 year old female with prior medical history as detailed below presents for evaluation following reported fall.  Patient fell out of her bed this evening.  She struck the back of her head.  She sustained a small laceration to the posterior scalp.  She is already wearing a cervical collar given the recent fall with C-spine injury.  Patient is otherwise without complaint.  She appears to be comfortable.  She does complain of mild pain around the posterior scalp laceration.  The history is provided by the patient and medical records.  Fall This is a new problem. The current episode started less than 1 hour ago. The problem occurs every several days. The problem has not changed since onset.Nothing aggravates the symptoms. Nothing relieves the symptoms.       Past Medical History:  Diagnosis Date  . Dementia ([place])   . GERD (gastroesophageal reflux disease)     There are no problems to display for this patient.   No past surgical history on file.   OB History   No obstetric history on file.     No family history on file.  Social History   Tobacco Use  . Smoking status: Unknown If Ever Smoked  Substance Use Topics  . Alcohol use: No  . Drug use: No    Home Medications Prior to Admission medications   Medication Sig Start Date End Date Taking? Authorizing Provider  acetaminophen (TYLENOL) 500 MG tablet Take 500 mg by mouth every 12 (twelve) hours.     [provider]  carbidopa-levodopa (SINEMET IR) 25-100 MG tablet Take 1 tablet by mouth daily.    [provider]  D2000 ULTRA STRENGTH 50 MCG (2000 UT) CAPS Take 2,000 Units by mouth daily. 04/05/19   [provider]  divalproex (DEPAKOTE SPRINKLE) 125 MG capsule Take 125  mg by mouth 3 (three) times daily.    [provider]  LORazepam (ATIVAN) 0.5 MG tablet Take 0.5 mg by mouth 2 (two) times daily.  06/05/19   [provider]  memantine (NAMENDA) 10 MG tablet Take 10 mg by mouth 2 (two) times daily.    [provider]  ondansetron (ZOFRAN) 4 MG tablet Take 1 tablet (4 mg total) by mouth every 6 (six) hours as needed for nausea or vomiting. Patient not taking: Reported on [id number]   Delora Fuel, MD  pantoprazole (PROTONIX) 40 MG tablet Take 40 mg by mouth daily.    [provider]  polyethylene glycol (MIRALAX / GLYCOLAX) packet Take 17 g by mouth daily.    [provider]  sennosides-docusate sodium (SENOKOT-S) 8.6-50 MG tablet Take 1 tablet by mouth daily.    [provider]  sertraline (ZOLOFT) 50 MG tablet Take 50 mg by mouth daily.    [provider]    Allergies    Patient has no known allergies.  Review of Systems   Review of Systems  All other systems reviewed and are negative.   Physical Exam Updated Vital Signs BP (!) 162/68 (BP Location: Left Arm)   Pulse 80   Temp (!) 97.5 F (36.4 C) (Oral)   Resp 14   Ht 4\' 11"  (1.499 m)   Wt 40.8 kg   SpO2 100%  BMI 18.18 kg/m   Physical Exam Vitals and nursing note reviewed.  Constitutional:      General: She is not in acute distress.    Appearance: Normal appearance. She is well-developed.  HENT:     Head: Normocephalic.     Comments: Small 1 cm laceration to the right posterior occiput   no active bleeding Eyes:     Conjunctiva/sclera: Conjunctivae normal.     Pupils: Pupils are equal, round, and reactive to light.  Neck:     Comments: Cervical collar in place Cardiovascular:     Rate and Rhythm: Normal rate and regular rhythm.     Heart sounds: Normal heart sounds.  Pulmonary:     Effort: Pulmonary effort is normal. No respiratory distress.     Breath sounds: Normal breath sounds.  Abdominal:     General:  There is no distension.     Palpations: Abdomen is soft.     Tenderness: There is no abdominal tenderness.  Musculoskeletal:        General: No deformity. Normal range of motion.     Cervical back: Normal range of motion and neck supple.  Skin:    General: Skin is warm and dry.  Neurological:     Mental Status: She is alert and oriented to person, place, and time.     ED Results / Procedures / Treatments   Labs (all labs ordered are listed, but only abnormal results are displayed) Labs Reviewed - No data to display  EKG None  Radiology CT Head Wo Contrast  Result Date: 06/28/2019 CLINICAL DATA:  Unwitnessed fall EXAM: CT HEAD WITHOUT CONTRAST TECHNIQUE: Contiguous axial images were obtained from the base of the skull through the vertex without intravenous contrast. COMPARISON:  June 06, 2019 FINDINGS: Brain: No evidence of acute territorial infarction, hemorrhage, hydrocephalus,extra-axial collection or mass lesion/mass effect. There is dilatation the ventricles and sulci consistent with age-related atrophy. Low-attenuation changes in the deep white matter consistent with small vessel ischemia. Vascular: No hyperdense vessel or unexpected calcification. Skull: The skull is intact. No fracture or focal lesion identified. Sinuses/Orbits: The visualized paranasal sinuses and mastoid air cells are clear. The orbits and globes intact. Other: Small hematoma and soft tissue swelling seen over the right parietal skull. Cervical spine: Alignment: There is reversal of the normal cervical lordosis. Skull base and vertebrae: Visualized skull base is intact. No atlanto-occipital dissociation. Again noted is a nondisplaced fracture of the posterior C2 vertebral arch extending to the left lamina. This is not significantly changed in appearance since the prior exam. The nondisplaced fracture seen through the anterior inferior C7 vertebral body is not well seen on today's examination. No new fractures are  identified. Soft tissues and spinal canal: The visualized paraspinal soft tissues are unremarkable. No prevertebral soft tissue swelling is seen. The spinal canal is grossly unremarkable, no large epidural collection or significant canal narrowing. Disc levels: Multilevel cervical spine spondylosis is seen with anterior osteophytes, disc osteophyte complex, uncovertebral osteophytes which is most notable at C4 through C6.6 Upper chest: Again noted is a 8 mm and 6 mm pulmonary nodules within the left upper lobe. There is also a thoracic inlet is within normal limits. Other: None IMPRESSION: 1.  No acute intracranial abnormality. 2. Findings consistent with age related atrophy and chronic small vessel ischemia 3. Small hematoma and soft tissue swelling seen right parietal skull. 4. Unchanged appearance to the bilateral posterior C2 arch fractures. The fracture of the anterior C7 vertebral body is  not well seen on today's examination. No acute fracture or malalignment. 5. Cervical spine spondylosis most notable from C4 through C6. 6. Unchanged 6 mm and 24mm pulmonary nodules in the left upper lobe Non-contrast chest CT at 3-6 months is recommended. If the nodules are stable at time of repeat CT, then future CT at 18-24 months (from today's scan) is considered optional for low-risk patients, but is recommended for high-risk patients. This recommendation follows the consensus statement: Guidelines for Management of Incidental Pulmonary Nodules Detected on CT Images: From the Fleischner Society 2017; Radiology 2017; 284:228-243. Electronically Signed   By: Prudencio Pair M.D.   On: 06/28/2019 21:49   CT Cervical Spine Wo Contrast  Result Date: 06/28/2019 CLINICAL DATA:  Unwitnessed fall EXAM: CT HEAD WITHOUT CONTRAST TECHNIQUE: Contiguous axial images were obtained from the base of the skull through the vertex without intravenous contrast. COMPARISON:  June 06, 2019 FINDINGS: Brain: No evidence of acute territorial  infarction, hemorrhage, hydrocephalus,extra-axial collection or mass lesion/mass effect. There is dilatation the ventricles and sulci consistent with age-related atrophy. Low-attenuation changes in the deep white matter consistent with small vessel ischemia. Vascular: No hyperdense vessel or unexpected calcification. Skull: The skull is intact. No fracture or focal lesion identified. Sinuses/Orbits: The visualized paranasal sinuses and mastoid air cells are clear. The orbits and globes intact. Other: Small hematoma and soft tissue swelling seen over the right parietal skull. Cervical spine: Alignment: There is reversal of the normal cervical lordosis. Skull base and vertebrae: Visualized skull base is intact. No atlanto-occipital dissociation. Again noted is a nondisplaced fracture of the posterior C2 vertebral arch extending to the left lamina. This is not significantly changed in appearance since the prior exam. The nondisplaced fracture seen through the anterior inferior C7 vertebral body is not well seen on today's examination. No new fractures are identified. Soft tissues and spinal canal: The visualized paraspinal soft tissues are unremarkable. No prevertebral soft tissue swelling is seen. The spinal canal is grossly unremarkable, no large epidural collection or significant canal narrowing. Disc levels: Multilevel cervical spine spondylosis is seen with anterior osteophytes, disc osteophyte complex, uncovertebral osteophytes which is most notable at C4 through C6.6 Upper chest: Again noted is a 8 mm and 6 mm pulmonary nodules within the left upper lobe. There is also a thoracic inlet is within normal limits. Other: None IMPRESSION: 1.  No acute intracranial abnormality. 2. Findings consistent with age related atrophy and chronic small vessel ischemia 3. Small hematoma and soft tissue swelling seen right parietal skull. 4. Unchanged appearance to the bilateral posterior C2 arch fractures. The fracture of the  anterior C7 vertebral body is not well seen on today's examination. No acute fracture or malalignment. 5. Cervical spine spondylosis most notable from C4 through C6. 6. Unchanged 6 mm and 24mm pulmonary nodules in the left upper lobe Non-contrast chest CT at 3-6 months is recommended. If the nodules are stable at time of repeat CT, then future CT at 18-24 months (from today's scan) is considered optional for low-risk patients, but is recommended for high-risk patients. This recommendation follows the consensus statement: Guidelines for Management of Incidental Pulmonary Nodules Detected on CT Images: From the Fleischner Society 2017; Radiology 2017; 284:228-243. Electronically Signed   By: Prudencio Pair M.D.   On: 06/28/2019 21:49    Procedures .Marland KitchenLaceration Repair  Date/Time: 06/28/2019 10:37 PM Performed by: Valarie Merino, MD Authorized by: Valarie Merino, MD   Consent:    Consent obtained:  Verbal  Consent given by:  Patient   Risks discussed:  Pain, infection, need for additional repair, poor cosmetic result and poor wound healing   Alternatives discussed:  No treatment Laceration details:    Location:  Scalp   Scalp location:  Occipital   Length (cm):  1 Repair type:    Repair type:  Simple Pre-procedure details:    Preparation:  Patient was prepped and draped in usual sterile fashion Exploration:    Hemostasis achieved with:  Direct pressure   Contaminated: no   Treatment:    Area cleansed with:  Saline   Amount of cleaning:  Standard Skin repair:    Repair method:  Staples   Number of staples:  2 Approximation:    Approximation:  Close Post-procedure details:    Dressing:  Bulky dressing   Patient tolerance of procedure:  Tolerated well, no immediate complications   (including critical care time)  Medications Ordered in ED Medications  Tdap (BOOSTRIX) injection 0.5 mL (0.5 mLs Intramuscular Given 06/28/19 2109)    ED Course  I have reviewed the triage vital signs  and the nursing notes.  Pertinent labs & imaging results that were available during my care of the patient were reviewed by me and considered in my medical decision making (see chart for details).    MDM Rules/Calculators/A&P                      MDM  Screen complete  Kimberly Dominguez was evaluated in Emergency Department on 06/28/2019 for the symptoms described in the history of present illness. She was evaluated in the context of the global COVID-19 pandemic, which necessitated consideration that the patient might be at risk for infection with the SARS-CoV-2 virus that causes COVID-19. Institutional protocols and algorithms that pertain to the evaluation of patients at risk for COVID-19 are in a state of rapid change based on information released by regulatory bodies including the CDC and federal and state organizations. These policies and algorithms were followed during the patient's care in the ED.  Patient is presenting for evaluation following ground-level fall.  Laceration to the posterior scalp repaired with 2 staples.  CT imaging did not reveal acute traumatic injury.  Patient appears appropriate for discharge.  Importance of close follow-up stressed.  Strict return precautions given and understood.   Final Clinical Impression(s) / ED Diagnoses Final diagnoses:  Fall, initial encounter  Laceration of scalp, initial encounter    Rx / DC Orders ED Discharge Orders    None       Valarie Merino, MD 06/28/19 2237

## 2019-06-28 NOTE — ED Notes (Signed)
Report and discharge instructions given to [place] at [facility] [employer]. All questions answered. POA Manus Gunning will take pt back to [facility]

## 2019-06-28 NOTE — Discharge Instructions (Signed)
Return for any problem.  Follow-up with your regular care provider as instructed.  Staples placed today will need to be removed in 5 to 7 days.  This can be done at our ED.

## 2019-06-28 NOTE — ED Notes (Signed)
Right is slighlty larger than the left. Pt is however at her cognitive baseline.

## 2019-06-28 NOTE — ED Notes (Signed)
Pt taken to CT.

## 2019-06-28 NOTE — ED Triage Notes (Signed)
Pt arrived [facility] from [facility]. Pt had an unwitnessed fall and sustained approx 2" laceration to the back of her head. EMS could not give LKW. Pt has dementia and is oriented only to person. Pt is Not on blood thinners. Staff advised that the Pt is currently at her baseline. EMS stated  That the Pt. Sustained fall 2 days ago.

## 2019-06-28 NOTE — ED Notes (Signed)
Patient's POA verbalizes understanding of discharge instructions and follow up care. Opportunity for questioning and answers were provided. All questions answered completely. PIV removed, catheter intact. Site dressed with gauze and tape. Armband removed by staff, pt discharged from ED. Pt wheeled from ED and taken back to [facility] by her POA, Manus Gunning.

## 2019-06-28 NOTE — ED Notes (Signed)
Staples placed by Dr. Francia Greaves. Laceration irrigation completed. Head wrapped in gauze and kerlix

## 2019-06-29 ENCOUNTER — Encounter (HOSPITAL_COMMUNITY): Payer: Self-pay | Admitting: Emergency Medicine

## 2019-06-29 ENCOUNTER — Emergency Department (HOSPITAL_COMMUNITY)
Admission: EM | Admit: 2019-06-29 | Discharge: 2019-06-29 | Disposition: A | Discharge status: Home / Self Care | Attending: Emergency Medicine | Admitting: Emergency Medicine

## 2019-06-29 ENCOUNTER — Other Ambulatory Visit: Payer: Self-pay

## 2019-06-29 DIAGNOSIS — I10 Essential (primary) hypertension: Secondary | ICD-10-CM | POA: Diagnosis not present

## 2019-06-29 DIAGNOSIS — S0181XD Laceration without foreign body of other part of head, subsequent encounter: Secondary | ICD-10-CM | POA: Diagnosis not present

## 2019-06-29 DIAGNOSIS — R58 Hemorrhage, not elsewhere classified: Secondary | ICD-10-CM | POA: Insufficient documentation

## 2019-06-29 DIAGNOSIS — F0390 Unspecified dementia without behavioral disturbance: Secondary | ICD-10-CM | POA: Diagnosis not present

## 2019-06-29 DIAGNOSIS — W19XXXD Unspecified fall, subsequent encounter: Secondary | ICD-10-CM | POA: Insufficient documentation

## 2019-06-29 DIAGNOSIS — Z79899 Other long term (current) drug therapy: Secondary | ICD-10-CM | POA: Insufficient documentation

## 2019-06-29 HISTORY — DX: Anemia, unspecified: D64.9

## 2019-06-29 HISTORY — DX: Essential (primary) hypertension: I10

## 2019-06-29 HISTORY — DX: Low back pain, unspecified: M54.50

## 2019-06-29 HISTORY — DX: Anxiety disorder, unspecified: F41.9

## 2019-06-29 HISTORY — DX: Unspecified osteoarthritis, unspecified site: M19.90

## 2019-06-29 NOTE — Discharge Instructions (Signed)
Your laceration that was repaired last night with staples continue to bleed.  There was one corner of the laceration that continued to ooze so we put 3 more staples in achieving hemostasis.  It stop bleeding.  We rebandaged it and watched it for period of time with no further bleeding.  Given the resolution of continued bleeding, we feel you are safe for discharge.  Please follow-up as previously planned with your PCP.  If any symptoms change or worsen or you develop signs and symptoms of infection, please return to nearest emergency department.

## 2019-06-29 NOTE — ED Triage Notes (Signed)
Patient from [facility], with laceration to back of head.  Patient was here last night and received 2 staples, laceration continues to bleed and patient has had 2 bandage changes since discharge last night.  No blood thinners.  Patient is at baseline due to dementia history.  Oriented to self.

## 2019-06-29 NOTE — ED Notes (Signed)
Wound cleaned, additional staples applied, bleeding controlled at this time. New aspen collar placed.

## 2019-06-29 NOTE — ED Provider Notes (Signed)
[facility] EMERGENCY DEPARTMENT Provider Note   CSN: [id number] Arrival date & time: 06/29/19  [id number]     History Chief Complaint  Patient presents with   Head Laceration    Kimberly Dominguez is a 86 y.o. female.  The history is provided by the patient, the EMS personnel and medical records. No language interpreter was used.  Laceration Location:  Head/neck Head/neck laceration location:  Scalp Depth:  Cutaneous Laceration mechanism:  Fall Relieved by:  Nothing Worsened by:  Nothing Ineffective treatments:  Pressure      Past Medical History:  Diagnosis Date   Anemia    Anxiety    Dementia (HCC)    GERD (gastroesophageal reflux disease)    Hypertension    Lumbago    Osteoarthritis     There are no problems to display for this patient.   History reviewed. No pertinent surgical history.   OB History   No obstetric history on file.     No family history on file.  Social History   Tobacco Use   Smoking status: Unknown If Ever Smoked  Substance Use Topics   Alcohol use: No   Drug use: No    Home Medications Prior to Admission medications   Medication Sig Start Date End Date Taking? Authorizing Provider  acetaminophen (TYLENOL) 500 MG tablet Take 500 mg by mouth every 12 (twelve) hours.     [provider]  carbidopa-levodopa (SINEMET IR) 25-100 MG tablet Take 1 tablet by mouth daily.    [provider]  D2000 ULTRA STRENGTH 50 MCG (2000 UT) CAPS Take 2,000 Units by mouth daily. 04/05/19   [provider]  divalproex (DEPAKOTE SPRINKLE) 125 MG capsule Take 125 mg by mouth 3 (three) times daily.    [provider]  LORazepam (ATIVAN) 0.5 MG tablet Take 0.5 mg by mouth 2 (two) times daily.  06/05/19   [provider]  memantine (NAMENDA) 10 MG tablet Take 10 mg by mouth 2 (two) times daily.    [provider]  ondansetron (ZOFRAN) 4 MG tablet Take 1 tablet (4 mg total) by  mouth every 6 (six) hours as needed for nausea or vomiting. Patient not taking: Reported on [id number]   Delora Fuel, MD  pantoprazole (PROTONIX) 40 MG tablet Take 40 mg by mouth daily.    [provider]  polyethylene glycol (MIRALAX / GLYCOLAX) packet Take 17 g by mouth daily.    [provider]  sennosides-docusate sodium (SENOKOT-S) 8.6-50 MG tablet Take 1 tablet by mouth daily.    [provider]  sertraline (ZOLOFT) 50 MG tablet Take 50 mg by mouth daily.    [provider]    Allergies    Patient has no known allergies.  Review of Systems   Review of Systems  Unable to perform ROS: Dementia  All other systems reviewed and are negative.   Physical Exam Updated Vital Signs BP 100/89 (BP Location: Left Arm)    Pulse 85    Temp (!) 97.1 F (36.2 C) (Temporal)    Resp 17    SpO2 100%   Physical Exam Vitals and nursing note reviewed.  Constitutional:      General: She is not in acute distress.    Appearance: She is not ill-appearing, toxic-appearing or diaphoretic.  HENT:     Head: Laceration present.      Nose: No congestion or rhinorrhea.     Mouth/Throat:     Pharynx: No  oropharyngeal exudate or posterior oropharyngeal erythema.  Eyes:     Extraocular Movements: Extraocular movements intact.     Conjunctiva/sclera: Conjunctivae normal.     Pupils: Pupils are equal, round, and reactive to light.  Neck:     Comments: In c collar Cardiovascular:     Rate and Rhythm: Normal rate.     Pulses: Normal pulses.     Heart sounds: No murmur.  Pulmonary:     Effort: Pulmonary effort is normal.     Breath sounds: No wheezing, rhonchi or rales.  Chest:     Chest wall: No tenderness.  Abdominal:     General: Abdomen is flat.     Tenderness: There is no abdominal tenderness.  Musculoskeletal:        General: Tenderness and signs of injury present.     Cervical back: No tenderness.  Skin:    Capillary Refill: Capillary refill takes  less than 2 seconds.  Neurological:     General: No focal deficit present.     Mental Status: She is alert.     ED Results / Procedures / Treatments   Labs (all labs ordered are listed, but only abnormal results are displayed) Labs Reviewed - No data to display  EKG None  Radiology CT Head Wo Contrast  Result Date: 06/28/2019 CLINICAL DATA:  Unwitnessed fall EXAM: CT HEAD WITHOUT CONTRAST TECHNIQUE: Contiguous axial images were obtained from the base of the skull through the vertex without intravenous contrast. COMPARISON:  June 06, 2019 FINDINGS: Brain: No evidence of acute territorial infarction, hemorrhage, hydrocephalus,extra-axial collection or mass lesion/mass effect. There is dilatation the ventricles and sulci consistent with age-related atrophy. Low-attenuation changes in the deep white matter consistent with small vessel ischemia. Vascular: No hyperdense vessel or unexpected calcification. Skull: The skull is intact. No fracture or focal lesion identified. Sinuses/Orbits: The visualized paranasal sinuses and mastoid air cells are clear. The orbits and globes intact. Other: Small hematoma and soft tissue swelling seen over the right parietal skull. Cervical spine: Alignment: There is reversal of the normal cervical lordosis. Skull base and vertebrae: Visualized skull base is intact. No atlanto-occipital dissociation. Again noted is a nondisplaced fracture of the posterior C2 vertebral arch extending to the left lamina. This is not significantly changed in appearance since the prior exam. The nondisplaced fracture seen through the anterior inferior C7 vertebral body is not well seen on today's examination. No new fractures are identified. Soft tissues and spinal canal: The visualized paraspinal soft tissues are unremarkable. No prevertebral soft tissue swelling is seen. The spinal canal is grossly unremarkable, no large epidural collection or significant canal narrowing. Disc levels:  Multilevel cervical spine spondylosis is seen with anterior osteophytes, disc osteophyte complex, uncovertebral osteophytes which is most notable at C4 through C6.6 Upper chest: Again noted is a 8 mm and 6 mm pulmonary nodules within the left upper lobe. There is also a thoracic inlet is within normal limits. Other: None IMPRESSION: 1.  No acute intracranial abnormality. 2. Findings consistent with age related atrophy and chronic small vessel ischemia 3. Small hematoma and soft tissue swelling seen right parietal skull. 4. Unchanged appearance to the bilateral posterior C2 arch fractures. The fracture of the anterior C7 vertebral body is not well seen on today's examination. No acute fracture or malalignment. 5. Cervical spine spondylosis most notable from C4 through C6. 6. Unchanged 6 mm and 39mm pulmonary nodules in the left upper lobe Non-contrast chest CT at 3-6 months is recommended. If the  nodules are stable at time of repeat CT, then future CT at 18-24 months (from today's scan) is considered optional for low-risk patients, but is recommended for high-risk patients. This recommendation follows the consensus statement: Guidelines for Management of Incidental Pulmonary Nodules Detected on CT Images: From the Fleischner Society 2017; Radiology 2017; 284:228-243. Electronically Signed   By: Prudencio Pair M.D.   On: 06/28/2019 21:49   CT Cervical Spine Wo Contrast  Result Date: 06/28/2019 CLINICAL DATA:  Unwitnessed fall EXAM: CT HEAD WITHOUT CONTRAST TECHNIQUE: Contiguous axial images were obtained from the base of the skull through the vertex without intravenous contrast. COMPARISON:  June 06, 2019 FINDINGS: Brain: No evidence of acute territorial infarction, hemorrhage, hydrocephalus,extra-axial collection or mass lesion/mass effect. There is dilatation the ventricles and sulci consistent with age-related atrophy. Low-attenuation changes in the deep white matter consistent with small vessel ischemia.  Vascular: No hyperdense vessel or unexpected calcification. Skull: The skull is intact. No fracture or focal lesion identified. Sinuses/Orbits: The visualized paranasal sinuses and mastoid air cells are clear. The orbits and globes intact. Other: Small hematoma and soft tissue swelling seen over the right parietal skull. Cervical spine: Alignment: There is reversal of the normal cervical lordosis. Skull base and vertebrae: Visualized skull base is intact. No atlanto-occipital dissociation. Again noted is a nondisplaced fracture of the posterior C2 vertebral arch extending to the left lamina. This is not significantly changed in appearance since the prior exam. The nondisplaced fracture seen through the anterior inferior C7 vertebral body is not well seen on today's examination. No new fractures are identified. Soft tissues and spinal canal: The visualized paraspinal soft tissues are unremarkable. No prevertebral soft tissue swelling is seen. The spinal canal is grossly unremarkable, no large epidural collection or significant canal narrowing. Disc levels: Multilevel cervical spine spondylosis is seen with anterior osteophytes, disc osteophyte complex, uncovertebral osteophytes which is most notable at C4 through C6.6 Upper chest: Again noted is a 8 mm and 6 mm pulmonary nodules within the left upper lobe. There is also a thoracic inlet is within normal limits. Other: None IMPRESSION: 1.  No acute intracranial abnormality. 2. Findings consistent with age related atrophy and chronic small vessel ischemia 3. Small hematoma and soft tissue swelling seen right parietal skull. 4. Unchanged appearance to the bilateral posterior C2 arch fractures. The fracture of the anterior C7 vertebral body is not well seen on today's examination. No acute fracture or malalignment. 5. Cervical spine spondylosis most notable from C4 through C6. 6. Unchanged 6 mm and 58mm pulmonary nodules in the left upper lobe Non-contrast chest CT at 3-6  months is recommended. If the nodules are stable at time of repeat CT, then future CT at 18-24 months (from today's scan) is considered optional for low-risk patients, but is recommended for high-risk patients. This recommendation follows the consensus statement: Guidelines for Management of Incidental Pulmonary Nodules Detected on CT Images: From the Fleischner Society 2017; Radiology 2017; 284:228-243. Electronically Signed   By: Prudencio Pair M.D.   On: 06/28/2019 21:49    Procedures .Marland KitchenLaceration Repair  Date/Time: 06/29/2019 7:54 AM Performed by: Courtney Paris, MD Authorized by: Courtney Paris, MD   Consent:    Consent obtained:  Emergent situation and verbal   Consent given by:  Patient   Risks discussed:  Pain and poor wound healing   Alternatives discussed:  No treatment Anesthesia (see MAR for exact dosages):    Anesthesia method:  None Laceration details:    Location:  Scalp   Scalp location:  Occipital   Length (cm):  1   Depth (mm):  1 Exploration:    Contaminated: no   Skin repair:    Repair method:  Staples   Number of staples:  3 (3 more staples were applied over existing staples to achieve hemostasis of recently repaired laceration overnight) Approximation:    Approximation:  Close Post-procedure details:    Dressing:  Tube gauze   Patient tolerance of procedure:  Tolerated well, no immediate complications   (including critical care time)  Medications Ordered in ED Medications - No data to display  ED Course  I have reviewed the triage vital signs and the nursing notes.  Pertinent labs & imaging results that were available during my care of the patient were reviewed by me and considered in my medical decision making (see chart for details).    MDM Rules/Calculators/A&P                      Kimberly Dominguez is a 86 y.o. female with a past medical history significant for dementia, hypertension and recent C-spine injury as well as visit  overnight for fall with laceration to her scalp presents with continued bleeding to her scalp.  According to EMS report to nursing, patient went back to [facility] memory care after having a fall to her head last night which received 2 staples.  They report that it bled through the bandages several times prompting her to be sent back for evaluation.  She is otherwise at her mental status baseline by report.  On my evaluation, patient denies any complaints but is soaking through her bandages.  She is not on blood thinners.  C-spine immobilization was maintained due to previous C-spine injury.  It was maintained during entire wound management process.  Bandages were taken down and on the patient's laceration with staples, there is one corner that keeps bleeding.  3 more staples were placed and the bleeding stopped.  There was no further bleeding and hemostasis was achieved.  She was rebandaged and redressed and her exam was otherwise unremarkable with lungs clear and chest and abdomen nontender.  Patient is still denying any complaints.  Given the termination of bleeding and patient's otherwise unchanged appearance, patient be discharged back to her facility.  Anticipate follow-up with PCP as previously planned.   Final Clinical Impression(s) / ED Diagnoses Final diagnoses:  Bleeding    Rx / DC Orders ED Discharge Orders    None      Clinical Impression: 1. Bleeding     Disposition: Discharge  Condition: Good  I have discussed the results, Dx and Tx plan with the pt(& family if present). He/she/they expressed understanding and agree(s) with the plan. Discharge instructions discussed at great length. Strict return precautions discussed and pt &/or family have verbalized understanding of the instructions. No further questions at time of discharge.    New Prescriptions   No medications on file    Follow Up: [facility] [address] [id number] [place]    Lajean Manes, [place] [employer] [address] [address] [phone]         Carless Slatten, Gwenyth Allegra, MD 06/29/19 [phone]

## 2020-04-04 DEATH — deceased

## 2020-08-11 IMAGING — CT CT CERVICAL SPINE W/O CM
3 of 6 series · 12 of 33 positions shown, 13 images · non-contrast
Comparison: June 06, 2019

CLINICAL DATA: Unwitnessed fall

EXAM:
CT HEAD WITHOUT CONTRAST
TECHNIQUE: Contiguous axial images were obtained from the base of the skull
through the vertex without intravenous contrast.

[Series 6: head 3.0 mpr cor · coronal · 0.34mm/px · 3 of 66 slices shown]
[im 14/66  bone]
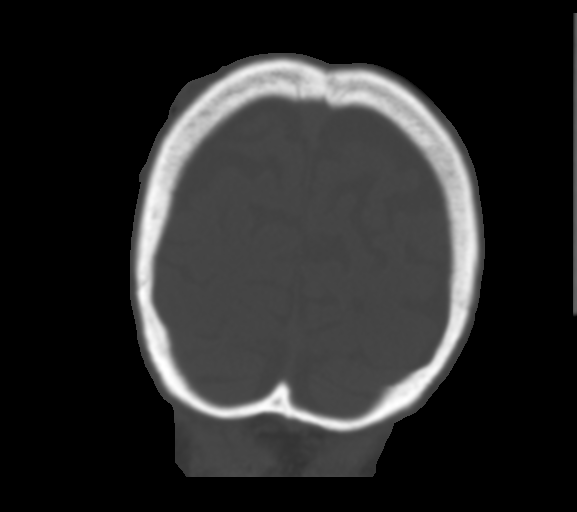
[im 27/66  bone]
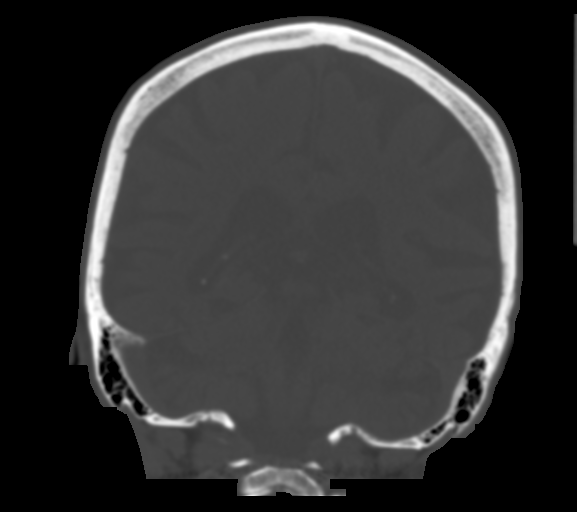
[im 40/66  bone]
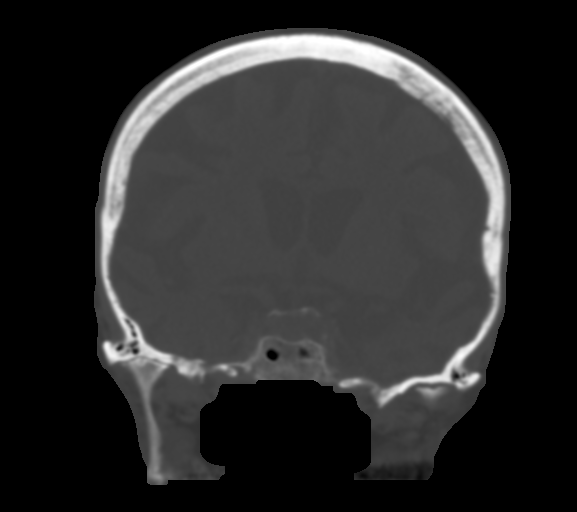

[Series 13: sagittal bone · sagittal · 0.23mm/px · 5 of 61 slices shown]
[im 11/61  bone]
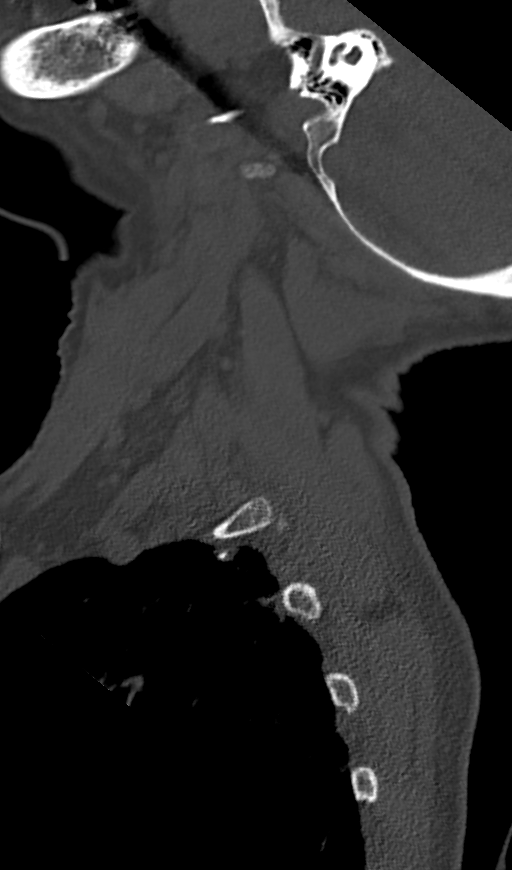
[im 21/61  bone]
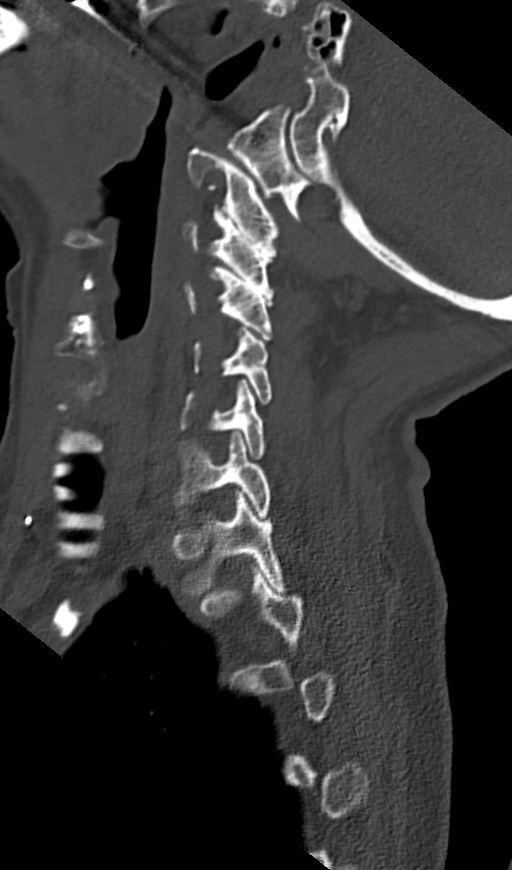
[im 31/61  bone]
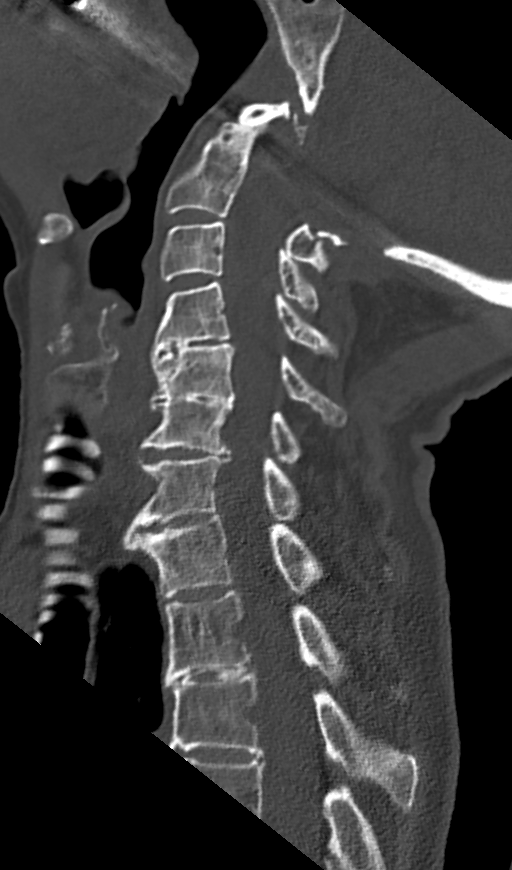
[im 41/61  bone]
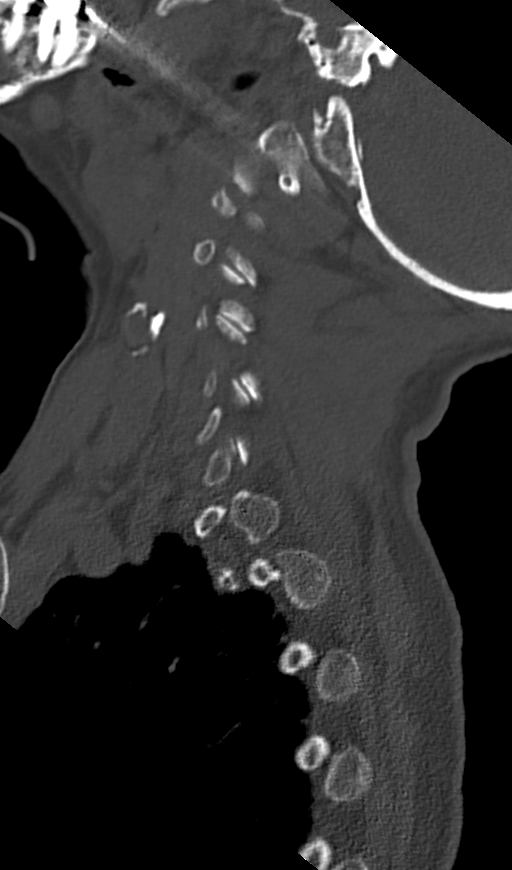
[im 51/61  bone]
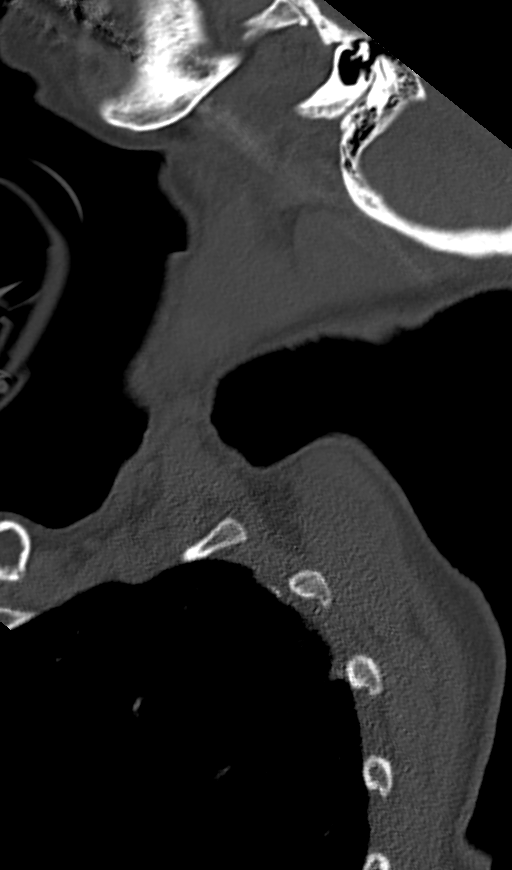

[Series 15: orthogonal axial st · axial · 0.21mm/px · z∈[-246,-145]mm · 4 of 92 slices shown, 5 images]
[im 19/92  soft-tissue]
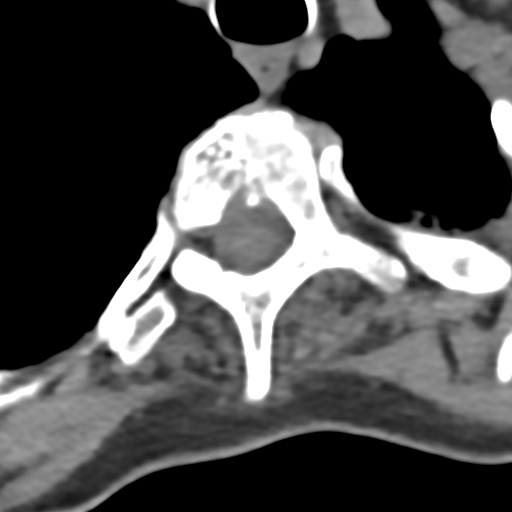
[im 19/92  bone]
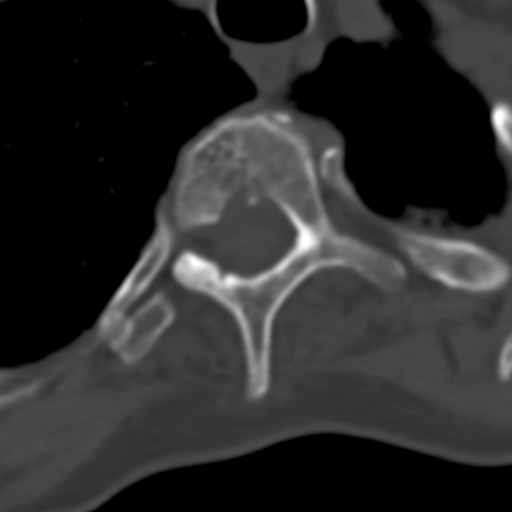
[im 37/92  bone]
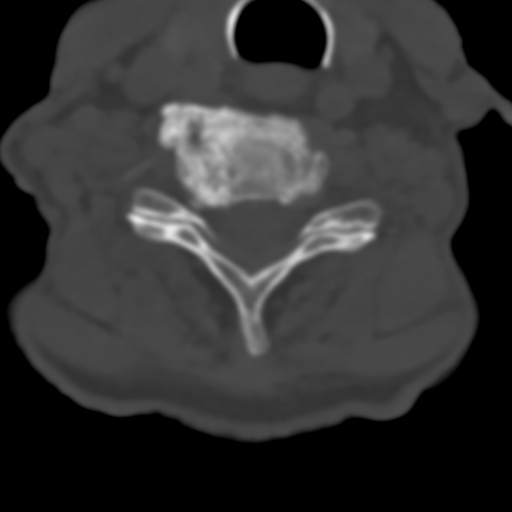
[im 55/92  bone]
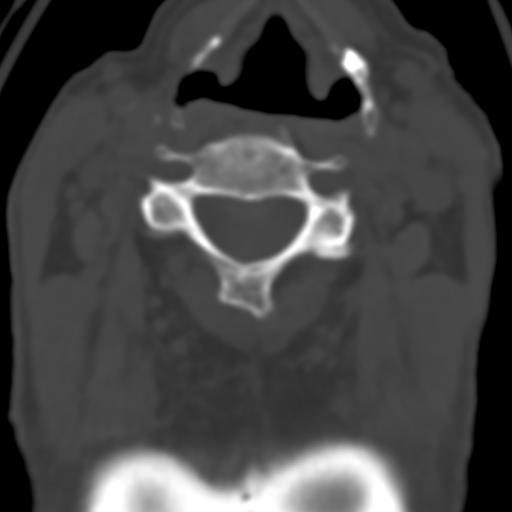
[im 73/92  bone]
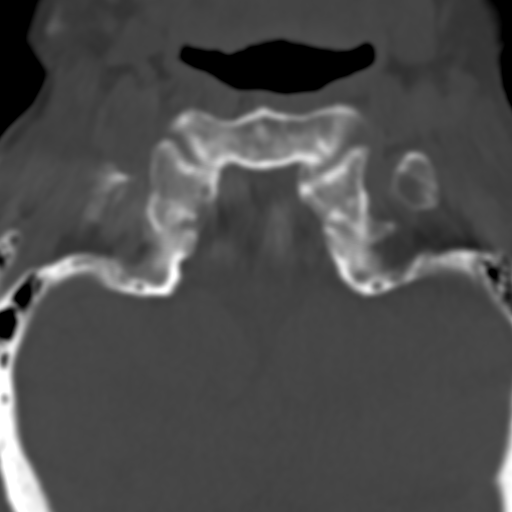

[12 of 33 positions shown; findings below may reference images not displayed]

FINDINGS: Brain: No evidence of acute territorial infarction, hemorrhage,
hydrocephalus,extra-axial collection or mass lesion/mass effect.
There is dilatation the ventricles and sulci consistent with
age-related atrophy. Low-attenuation changes in the deep white
matter consistent with small vessel ischemia.

Vascular: No hyperdense vessel or unexpected calcification.

Skull: The skull is intact. No fracture or focal lesion identified.

Sinuses/Orbits: The visualized paranasal sinuses and mastoid air
cells are clear. The orbits and globes intact.

Other: Small hematoma and soft tissue swelling seen over the right
parietal skull.

Cervical spine:

Alignment: There is reversal of the normal cervical lordosis.

Skull base and vertebrae: Visualized skull base is intact. No
atlanto-occipital dissociation. Again noted is a nondisplaced
fracture of the posterior C2 vertebral arch extending to the left
lamina. This is not significantly changed in appearance since the
prior exam. The nondisplaced fracture seen through the anterior
inferior C7 vertebral body is not well seen on today's examination.
No new fractures are identified.

Soft tissues and spinal canal: The visualized paraspinal soft
tissues are unremarkable. No prevertebral soft tissue swelling is
seen. The spinal canal is grossly unremarkable, no large epidural
collection or significant canal narrowing.

Disc levels: Multilevel cervical spine spondylosis is seen with
anterior osteophytes, disc osteophyte complex, uncovertebral
osteophytes which is most notable at C4 through

Upper chest: Again noted is a 8 mm and 6 mm pulmonary nodules within
the left upper lobe. There is also a thoracic inlet is within normal
limits.

Other: None
IMPRESSION: 1.  No acute intracranial abnormality.
2. Findings consistent with age related atrophy and chronic small
vessel ischemia
3. Small hematoma and soft tissue swelling seen right parietal
skull.
4. Unchanged appearance to the bilateral posterior C2 arch
fractures. The fracture of the anterior C7 vertebral body is not
well seen on today's examination. No acute fracture or malalignment.
5. Cervical spine spondylosis most notable from C4 through C6.
6. Unchanged 6 mm and 8mm pulmonary nodules in the left upper lobe
Non-contrast chest CT at 3-6 months is recommended. If the nodules
are stable at time of repeat CT, then future CT at 18-24 months
(from today's scan) is considered optional for low-risk patients,
but is recommended for high-risk patients. This recommendation
follows the consensus statement: Guidelines for Management of
Incidental Pulmonary Nodules Detected on CT Images: From the
